# Patient Record
Sex: Male | Born: 1980 | Race: White | Hispanic: No | Marital: Married | Smoking: Never smoker
Health system: Southern US, Community
[De-identification: ages and names within clinical notes are randomized; demographics above are authoritative.]

## PROBLEM LIST (undated history)

## (undated) DIAGNOSIS — U071 COVID-19: Secondary | ICD-10-CM

## (undated) DIAGNOSIS — I10 Essential (primary) hypertension: Secondary | ICD-10-CM

## (undated) HISTORY — PX: WISDOM TOOTH EXTRACTION: SHX21

## (undated) HISTORY — DX: Essential (primary) hypertension: I10

---

## 2017-05-09 ENCOUNTER — Encounter: Payer: Self-pay | Admitting: Family Medicine

## 2017-05-09 ENCOUNTER — Ambulatory Visit (INDEPENDENT_AMBULATORY_CARE_PROVIDER_SITE_OTHER): Payer: Managed Care, Other (non HMO) | Admitting: Family Medicine

## 2017-05-09 VITALS — BP 128/84 | HR 85 | Temp 98.3°F | Resp 16 | Ht 70.0 in | Wt 242.0 lb

## 2017-05-09 DIAGNOSIS — Z13220 Encounter for screening for lipoid disorders: Secondary | ICD-10-CM | POA: Diagnosis not present

## 2017-05-09 DIAGNOSIS — I10 Essential (primary) hypertension: Secondary | ICD-10-CM

## 2017-05-09 MED ORDER — LOSARTAN POTASSIUM-HCTZ 50-12.5 MG PO TABS: 1.0000 | ORAL_TABLET | Freq: Every day | ORAL | 1 refills | Status: DC

## 2017-05-09 NOTE — Progress Notes (Signed)
Subjective:  By signing my name below, I, Cameron Norris, attest that this documentation has been prepared under the direction and in the presence of Cameron Flood, MD Electronically Signed: Charline Bills, ED Scribe 05/09/2017 at 9:13 AM.   Patient ID: Cameron Norris, male    DOB: 1980/11/04, 36 y.o.   MRN: [id number]  Chief Complaint  Patient presents with  . Medication Refill    LOSARTAN/ pt is new to area and need BP medication   HPI [name] is a 36 y.o. male who presents to Primary Care at [facility] for medication refill. New pt here to establish care. Previously received care at the [facility] for HTN. Most recently on Losartan-HCTZ 50-12.5 mg qd. Previous PCP Waldron Session. Last OV September 2017. BP was elevated at 159/90 at that visit. He was changed from ace inhibitor to ARB and added the HCTZ at that visit.   Reports dry cough while taking Lisinopril which resolved once he discontinued the medication. Pt denies sob with Lisinopril. Pt checks his BP outside of the office with average reading of 135/high 80s. Denies chest pain, light-headedness, dizziness, blood in stools, melena. He walks for exercise. Pt is fasting at this visit.  Pt has a 2 y.o. son.   There are no active problems to display for this patient.  Past Medical History:  Diagnosis Date  . Hypertension    Past Surgical History:  Procedure Laterality Date  . WISDOM TOOTH EXTRACTION     Allergies not on file Prior to Admission medications   Not on File   Social History   Social History  . Marital status: Married    Spouse name: N/A  . Number of children: N/A  . Years of education: N/A   Occupational History  . Not on file.   Social History Main Topics  . Smoking status: Never Smoker  . Smokeless tobacco: Never Used  . Alcohol use Yes  . Drug use: No  . Sexual activity: Not on file   Other Topics Concern  . Not on file   Social History Narrative  . No narrative on file    Review of Systems  Constitutional: Negative for fatigue and unexpected weight change.  Eyes: Negative for visual disturbance.  Respiratory: Negative for cough, chest tightness and shortness of breath.   Cardiovascular: Negative for chest pain, palpitations and leg swelling.  Gastrointestinal: Negative for abdominal pain and blood in stool.  Neurological: Negative for dizziness, light-headedness and headaches.      Objective:   Physical Exam  Constitutional: He is oriented to person, place, and time. He appears well-developed and well-nourished.  HENT:  Head: Normocephalic and atraumatic.  Eyes: Pupils are equal, round, and reactive to light. EOM are normal.  Neck: No JVD present. Carotid bruit is not present.  Cardiovascular: Normal rate, regular rhythm and normal heart sounds.   No murmur heard. Pulmonary/Chest: Effort normal and breath sounds normal. He has no rales.  Musculoskeletal: He exhibits no edema.  Neurological: He is alert and oriented to person, place, and time.  Skin: Skin is warm and dry.  Psychiatric: He has a normal mood and affect.  Vitals reviewed.  Vitals:   05/09/17 0853  BP: 128/84  Pulse: 85  Resp: 16  Temp: 98.3 F (36.8 C)  TempSrc: Oral  SpO2: 98%  Weight: 242 lb (109.8 kg)  Height:  (1.778 m)      Assessment & Plan:   Cameron Norris is a  36 y.o. male Essential hypertension - Plan: Comprehensive metabolic panel, losartan-hydrochlorothiazide (HYZAAR) 50-12.5 MG tablet  Screening for hyperlipidemia - Plan: Comprehensive metabolic panel, Lipid panel  Hypertension controlled, and tolerating ARB. Suspect he did have a cough from ace inhibitors, so allergy was placed. Discussed exercise with 150 minutes minimum per week, recheck in 6 months for a physical. Labs ordered, lipid panel ordered.  Meds ordered this encounter  Medications  . DISCONTD: losartan-hydrochlorothiazide (HYZAAR) 50-12.5 MG tablet    Sig: Take 1 tablet by mouth daily.    Marland Kitchen losartan-hydrochlorothiazide (HYZAAR) 50-12.5 MG tablet    Sig: Take 1 tablet by mouth daily.    Dispense:  90 tablet    Refill:  1   Patient Instructions   Nice meeting you. Blood pressure looks okay today. No change in meds. Follow-up in 6 months for a physical. Let me know if you have any questions in the meantime.    IF you received an x-ray today, you will receive an invoice from [facility]. Please contact [facility] at [phone] with questions or concerns regarding your invoice.   IF you received labwork today, you will receive an invoice from [place]. Please contact [facility] at [phone] with questions or concerns regarding your invoice.   Our billing staff will not be able to assist you with questions regarding bills from these companies.  You will be contacted with the lab results as soon as they are available. The fastest way to get your results is to activate your My Chart account. Instructions are located on the last page of this paperwork. If you have not heard from [place] regarding the results in 2 weeks, please contact this office.      I personally performed the services described in this documentation, which was scribed in my presence. The recorded information has been reviewed and considered for accuracy and completeness, addended by me as needed, and agree with information above.  Signed,   Meredith Staggers, MD Primary Care at [facility].  05/09/17 9:25 AM

## 2017-05-09 NOTE — Patient Instructions (Addendum)
Nice meeting you. Blood pressure looks okay today. No change in meds. Follow-up in 6 months for a physical. Let me know if you have any questions in the meantime.    IF you received an x-ray today, you will receive an invoice from [facility]. Please contact [facility] at [phone] with questions or concerns regarding your invoice.   IF you received labwork today, you will receive an invoice from [place]. Please contact [facility] at [phone] with questions or concerns regarding your invoice.   Our billing staff will not be able to assist you with questions regarding bills from these companies.  You will be contacted with the lab results as soon as they are available. The fastest way to get your results is to activate your My Chart account. Instructions are located on the last page of this paperwork. If you have not heard from [place] regarding the results in 2 weeks, please contact this office.

## 2017-05-10 LAB — LIPID PANEL
CHOLESTEROL TOTAL: 210 mg/dL — AB (ref 100–199)
Chol/HDL Ratio: 4.1 ratio (ref 0.0–5.0)
HDL: 51 mg/dL (ref >39)
LDL CALC: 117 mg/dL — AB (ref 0–99)
TRIGLYCERIDES: 211 mg/dL — AB (ref 0–149)
VLDL Cholesterol Cal: 42 mg/dL — ABNORMAL HIGH (ref 5–40)

## 2017-05-10 LAB — COMPREHENSIVE METABOLIC PANEL
ALBUMIN: 4.7 g/dL (ref 3.5–5.5)
ALK PHOS: 51 IU/L (ref 39–117)
ALT: 58 IU/L — ABNORMAL HIGH (ref 0–44)
AST: 33 IU/L (ref 0–40)
Albumin/Globulin Ratio: 1.8 (ref 1.2–2.2)
BUN/Creatinine Ratio: 12 (ref 9–20)
BUN: 10 mg/dL (ref 6–20)
Bilirubin Total: 0.3 mg/dL (ref 0.0–1.2)
CO2: 20 mmol/L (ref 20–29)
CREATININE: 0.82 mg/dL (ref 0.76–1.27)
Calcium: 9.1 mg/dL (ref 8.7–10.2)
Chloride: 101 mmol/L (ref 96–106)
GFR calc Af Amer: 133 mL/min/{1.73_m2} (ref >59)
GFR calc non Af Amer: 115 mL/min/{1.73_m2} (ref >59)
GLUCOSE: 89 mg/dL (ref 65–99)
Globulin, Total: 2.6 g/dL (ref 1.5–4.5)
Potassium: 4.2 mmol/L (ref 3.5–5.2)
Sodium: 140 mmol/L (ref 134–144)
Total Protein: 7.3 g/dL (ref 6.0–8.5)

## 2017-06-14 ENCOUNTER — Telehealth: Payer: Self-pay | Admitting: Family Medicine

## 2017-06-14 NOTE — Telephone Encounter (Signed)
PT IS AT [facility] UNTIL NOV 2-18 HIS PHARMACY ASKED HIM TO CALL US FOR A VACATION REFILL ON HIS BLOOD PRESSURE THE PHARMACY TO SEND TO   [facility] [id number]  PHONE NUMBER [phone]  PT IS COMPLETELY OUT

## 2017-06-16 ENCOUNTER — Other Ambulatory Visit: Payer: Self-pay

## 2017-06-16 DIAGNOSIS — I10 Essential (primary) hypertension: Secondary | ICD-10-CM

## 2017-06-16 MED ORDER — LOSARTAN POTASSIUM-HCTZ 50-12.5 MG PO TABS: 1.0000 | ORAL_TABLET | Freq: Every day | ORAL | 1 refills | Status: DC

## 2017-06-17 NOTE — Telephone Encounter (Signed)
Done on 10/25

## 2017-09-23 ENCOUNTER — Encounter (INDEPENDENT_AMBULATORY_CARE_PROVIDER_SITE_OTHER): Payer: Self-pay | Admitting: Orthopaedic Surgery

## 2017-09-23 ENCOUNTER — Ambulatory Visit (INDEPENDENT_AMBULATORY_CARE_PROVIDER_SITE_OTHER): Payer: Managed Care, Other (non HMO) | Admitting: Orthopaedic Surgery

## 2017-09-23 VITALS — BP 156/96 | HR 67 | Ht 70.0 in | Wt 235.0 lb

## 2017-09-23 DIAGNOSIS — S52135A Nondisplaced fracture of neck of left radius, initial encounter for closed fracture: Secondary | ICD-10-CM | POA: Diagnosis not present

## 2017-09-23 DIAGNOSIS — S52124A Nondisplaced fracture of head of right radius, initial encounter for closed fracture: Secondary | ICD-10-CM

## 2017-09-23 NOTE — Progress Notes (Signed)
Office Visit Note   Patient: Cameron BenceMichael Norris           Date of Birth: [birth year]           MRN: [id number] Visit Date: 09/23/2017              Requested by: No referring provider defined for this encounter. PCP: Patient, No Pcp Per   Assessment & Plan: Visit Diagnoses:  1. Closed nondisplaced fracture of head of right radius, initial encounter   2. Closed nondisplaced fracture of neck of left radius, initial encounter     BILATERAL INJURIES.   Plan: patient states he is able to work. He can remove the left posterior splint for bathing. We discussed gentle ROM excercises working mostly on extension which is the biggest obstacle for motion. Recheck in 3 wks for repeat xrays.   Follow-Up Instructions: Return in about 3 weeks (around 10/14/2017).   Orders:  No orders of the defined types were placed in this encounter.  No orders of the defined types were placed in this encounter.     Procedures: No procedures performed   Clinical Data: No additional findings.   Subjective: Chief Complaint  Patient presents with  . Left Elbow - Pain  . Right Elbow - Pain, Fracture    HPI 37 year old male new patient with acute injuries to both elbows.  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  Patient suffered a closed nondisplaced fracture of the right radial head involving half of the head without depression or angulation..  On the left elbow he suffered a radial neck fracture without angulation or significant displacement.  Patient states left elbow is been much more painful and he has in a posterior splint.  He has a prescription for Norco and has been using ibuprofen.  Patient is a non-smoker no past history of injury to the elbow shoulder pain he did not hit his head no loss of consciousness.  Review of Systems with history for stroke or heart attack previous upper extremity fractures.  Negative for  anesthetic problems for DVT.Marland Kitchen.  Otherwise negative as it pertains to HPI.   Objective: Vital Signs: BP (!) 156/96   Pulse 67   Ht 5\' 10"  (1.778 m)   Wt 235 lb (106.6 kg)   BMI 33.72 kg/m   Physical Exam  Constitutional: He is oriented to person, place, and time. He appears well-developed and well-nourished.  HENT:  Head: Normocephalic and atraumatic.  Eyes: EOM are normal. Pupils are equal, round, and reactive to light.  Neck: No tracheal deviation present. No thyromegaly present.  Cardiovascular: Normal rate.  Pulmonary/Chest: Effort normal. He has no wheezes.  Abdominal: Soft. Bowel sounds are normal.  Neurological: He is alert and oriented to person, place, and time.  Skin: Skin is warm and dry. Capillary refill takes less than 2 seconds.  Psychiatric: He has a normal mood and affect. His behavior is normal. Judgment and thought content normal.    Ortho Exam patient has bilateral elbow hemarthrosis.  Tenderness over the radial head radial neck right and left.  45 degrees range of motion both elbows with more pain on the left than right.  Full shoulder range of motion minimal hand swelling sensation in the fingertips is normal radial sensory nerve median ulnar nerve is intact.  Specialty Comments:  No specialty comments available.  Imaging: X-ray images on disc were reviewed as listed above  from [facility] imaging.  Right radial head fracture, left radial neck fracture both in satisfactory position.   PMFS History: Patient Active Problem List   Diagnosis Date Noted  . Closed nondisplaced fracture of neck of left radius 10/14/2017  . Closed nondisplaced fracture of head of radius with routine healing 10/14/2017   Past Medical History:  Diagnosis Date  . Hypertension     History reviewed. No pertinent family history.  Past Surgical History:  Procedure Laterality Date  . WISDOM TOOTH EXTRACTION     Social History   Occupational History  . Not on file  Tobacco  Use  . Smoking status: Never Smoker  . Smokeless tobacco: Never Used  Substance and Sexual Activity  . Alcohol use: Yes  . Drug use: No  . Sexual activity: Not on file

## 2017-10-14 ENCOUNTER — Encounter (INDEPENDENT_AMBULATORY_CARE_PROVIDER_SITE_OTHER): Payer: Self-pay | Admitting: Orthopaedic Surgery

## 2017-10-14 ENCOUNTER — Ambulatory Visit (INDEPENDENT_AMBULATORY_CARE_PROVIDER_SITE_OTHER): Payer: Managed Care, Other (non HMO) | Admitting: Orthopaedic Surgery

## 2017-10-14 ENCOUNTER — Ambulatory Visit (INDEPENDENT_AMBULATORY_CARE_PROVIDER_SITE_OTHER): Payer: Self-pay

## 2017-10-14 ENCOUNTER — Ambulatory Visit (INDEPENDENT_AMBULATORY_CARE_PROVIDER_SITE_OTHER): Payer: Managed Care, Other (non HMO)

## 2017-10-14 VITALS — BP 138/78 | HR 90 | Ht 70.0 in | Wt 235.0 lb

## 2017-10-14 DIAGNOSIS — M25521 Pain in right elbow: Secondary | ICD-10-CM | POA: Diagnosis not present

## 2017-10-14 DIAGNOSIS — M25522 Pain in left elbow: Secondary | ICD-10-CM

## 2017-10-14 DIAGNOSIS — S52135D Nondisplaced fracture of neck of left radius, subsequent encounter for closed fracture with routine healing: Secondary | ICD-10-CM

## 2017-10-14 DIAGNOSIS — S52126D Nondisplaced fracture of head of unspecified radius, subsequent encounter for closed fracture with routine healing: Secondary | ICD-10-CM | POA: Insufficient documentation

## 2017-10-14 DIAGNOSIS — S52124D Nondisplaced fracture of head of right radius, subsequent encounter for closed fracture with routine healing: Secondary | ICD-10-CM

## 2017-10-14 DIAGNOSIS — S52135A Nondisplaced fracture of neck of left radius, initial encounter for closed fracture: Secondary | ICD-10-CM | POA: Insufficient documentation

## 2017-10-14 NOTE — Progress Notes (Signed)
   Post-Op Visit Note   Patient: Natasha BenceMichael Luchsinger           Date of Birth: [birth year]           MRN: [id number] Visit Date: 10/14/2017 PCP: Patient, No Pcp Per   Assessment & Plan: Follow-up right radial head fracture and left radial neck fracture.  His motion is significantly improved.  He will continue to work on extension.  Follow-up in 4 weeks for repeat x-rays.  Chief Complaint:  Chief Complaint  Patient presents with  . Right Elbow - Follow-up, Fracture    3 wks post closed nondisplaced fx head right radius.  . Left Elbow - Follow-up, Fracture    3 weeks post closed nondisplaced fracture neck left radius.   Visit Diagnoses:  1. Pain in left elbow   2. Pain in right elbow   3. Closed nondisplaced fracture of neck of left radius with routine healing, subsequent encounter   4. Closed nondisplaced fracture of head of right radius with routine healing, subsequent encounter     Plan: Patient has good flexion lacks about 4 fingerbreadths on the left touching thumb to shoulder and right elbow is about 2 fingerbreadths.  He comes almost to full extension lacking maybe 5 degrees.  He is continue to work on extension.  He has been wearing the splint at night just when he sleeps.  I plan to recheck him again in 4 weeks for likely final x-rays.  Follow-Up Instructions: Return in about 4 weeks (around 11/11/2017).   Orders:  Orders Placed This Encounter  Procedures  . XR Elbow 2 Views Left  . XR Elbow 2 Views Right   No orders of the defined types were placed in this encounter.   Imaging: Xr Elbow 2 Views Left  Result Date: 10/14/2017 2 view x-rays left elbow obtained.  This shows the radial neck fracture without angulation and 1 mm impaction.  Radial head is opposite the capitellum. Impression: Follow-up radial neck fracture remaining in satisfactory position.  Xr Elbow 2 Views Right  Result Date: 10/14/2017 Three-view x-rays right elbow including radial head view obtained.  This  shows a radial head fracture involving about one half the joint without  depression and without displacement.  Position is maintained from previous films 3 weeks ago. Impression: Right radial head fracture remaining in satisfactory position.   PMFS History: Patient Active Problem List   Diagnosis Date Noted  . Closed nondisplaced fracture of neck of left radius 10/14/2017  . Closed nondisplaced fracture of head of radius with routine healing 10/14/2017   Past Medical History:  Diagnosis Date  . Hypertension     History reviewed. No pertinent family history.  Past Surgical History:  Procedure Laterality Date  . WISDOM TOOTH EXTRACTION     Social History   Occupational History  . Not on file  Tobacco Use  . Smoking status: Never Smoker  . Smokeless tobacco: Never Used  Substance and Sexual Activity  . Alcohol use: Yes  . Drug use: No  . Sexual activity: Not on file

## 2017-11-11 ENCOUNTER — Encounter (INDEPENDENT_AMBULATORY_CARE_PROVIDER_SITE_OTHER): Payer: Self-pay | Admitting: Orthopaedic Surgery

## 2017-11-11 ENCOUNTER — Ambulatory Visit (INDEPENDENT_AMBULATORY_CARE_PROVIDER_SITE_OTHER): Payer: Self-pay

## 2017-11-11 ENCOUNTER — Ambulatory Visit (INDEPENDENT_AMBULATORY_CARE_PROVIDER_SITE_OTHER): Payer: Managed Care, Other (non HMO) | Admitting: Orthopaedic Surgery

## 2017-11-11 VITALS — BP 149/89 | HR 96

## 2017-11-11 DIAGNOSIS — S52124D Nondisplaced fracture of head of right radius, subsequent encounter for closed fracture with routine healing: Secondary | ICD-10-CM

## 2017-11-11 DIAGNOSIS — S52135D Nondisplaced fracture of neck of left radius, subsequent encounter for closed fracture with routine healing: Secondary | ICD-10-CM

## 2017-11-11 NOTE — Progress Notes (Signed)
   Post-Op Visit Note   Patient: Cameron Norris           Date of Birth: 1981-05-16           MRN: [id number] Visit Date: 11/11/2017 PCP: Patient, No Pcp Per   Assessment & Plan: Follow-up bilateral elbow fractures.  He has some hyperextension on the left he lacks about 5 degrees reaching full extension on the right.  Flexion is good swelling is down his pain is minimal.  He is working.  He can wait 1 month and then slowly begin playing golf starting hitting balls on a T after he set one small bucket on the range to make sure he does not have pain.  He will call if he has any problems.  We reviewed x-rays finding today which showed interval healing.  Chief Complaint:  Chief Complaint  Patient presents with  . Left Elbow - Fracture, Follow-up  . Right Elbow - Fracture, Follow-up   Visit Diagnoses:  1. Closed nondisplaced fracture of neck of left radius with routine healing, subsequent encounter   2. Closed nondisplaced fracture of head of right radius with routine healing, subsequent encounter     Plan: Patient will progress as listed above with plan for gradual resumption of sports activities in 1 month with golf.  He can return if he has  Follow-Up Instructions: No follow-ups on file.   Orders:  Orders Placed This Encounter  Procedures  . XR Elbow Complete Right (3+View)  . XR Elbow 2 Views Left   No orders of the defined types were placed in this encounter.   Imaging: No results found.  PMFS History: Patient Active Problem List   Diagnosis Date Noted  . Closed nondisplaced fracture of neck of left radius 10/14/2017  . Closed nondisplaced fracture of head of radius with routine healing 10/14/2017   Past Medical History:  Diagnosis Date  . Hypertension     No family history on file.  Past Surgical History:  Procedure Laterality Date  . WISDOM TOOTH EXTRACTION     Social History   Occupational History  . Not on file  Tobacco Use  . Smoking status: Never Smoker    . Smokeless tobacco: Never Used  Substance and Sexual Activity  . Alcohol use: Yes  . Drug use: No  . Sexual activity: Not on file

## 2017-12-01 ENCOUNTER — Other Ambulatory Visit: Payer: Self-pay

## 2017-12-02 ENCOUNTER — Other Ambulatory Visit: Payer: Self-pay | Admitting: Family Medicine

## 2017-12-02 MED ORDER — HYDROCHLOROTHIAZIDE 12.5 MG PO CAPS: 12.5000 mg | ORAL_CAPSULE | Freq: Every day | ORAL | 0 refills | Status: DC

## 2017-12-02 MED ORDER — LOSARTAN POTASSIUM 50 MG PO TABS: 50.0000 mg | ORAL_TABLET | Freq: Every day | ORAL | 0 refills | Status: DC

## 2017-12-08 ENCOUNTER — Ambulatory Visit: Payer: Managed Care, Other (non HMO) | Admitting: Family Medicine

## 2017-12-15 ENCOUNTER — Other Ambulatory Visit: Payer: Self-pay

## 2017-12-15 ENCOUNTER — Ambulatory Visit: Payer: Managed Care, Other (non HMO) | Admitting: Family Medicine

## 2017-12-15 ENCOUNTER — Encounter: Payer: Self-pay | Admitting: Family Medicine

## 2017-12-15 VITALS — BP 142/82 | HR 89 | Temp 98.1°F | Ht 70.0 in | Wt 245.4 lb

## 2017-12-15 DIAGNOSIS — I10 Essential (primary) hypertension: Secondary | ICD-10-CM

## 2017-12-15 DIAGNOSIS — E785 Hyperlipidemia, unspecified: Secondary | ICD-10-CM

## 2017-12-15 MED ORDER — HYDROCHLOROTHIAZIDE 12.5 MG PO CAPS: 12.5000 mg | ORAL_CAPSULE | Freq: Every day | ORAL | 1 refills | Status: DC

## 2017-12-15 MED ORDER — LOSARTAN POTASSIUM 50 MG PO TABS: 50.0000 mg | ORAL_TABLET | Freq: Every day | ORAL | 1 refills | Status: DC

## 2017-12-15 NOTE — Patient Instructions (Addendum)
Okay to continue same dose of medications for now. Increase physical activity, watch diet and see information below on hypertension, and recheck in the next 3 to 6 months for a physical.  If persistently elevated at that time, we can adjust the doses.  If you are noticing higher blood pressure readings outside of office, I am happy to see you sooner to adjust that medication.  I will check cholesterol today, but again we can see what diet/exercise changes may do with repeat testing in 3 to 6 months.  Let me know if you have any questions, and thank you for coming in today.   Hypertension Hypertension, commonly called high blood pressure, is when the force of blood pumping through the arteries is too strong. The arteries are the blood vessels that carry blood from the heart throughout the body. Hypertension forces the heart to work harder to pump blood and may cause arteries to become narrow or stiff. Having untreated or uncontrolled hypertension can cause heart attacks, strokes, kidney disease, and other problems. A blood pressure reading consists of a higher number over a lower number. Ideally, your blood pressure should be below 120/80. The first ("top") number is called the systolic pressure. It is a measure of the pressure in your arteries as your heart beats. The second ("bottom") number is called the diastolic pressure. It is a measure of the pressure in your arteries as the heart relaxes. What are the causes? The cause of this condition is not known. What increases the risk? Some risk factors for high blood pressure are under your control. Others are not. Factors you can change  Smoking.  Having type 2 diabetes mellitus, high cholesterol, or both.  Not getting enough exercise or physical activity.  Being overweight.  Having too much fat, sugar, calories, or salt (sodium) in your diet.  Drinking too much alcohol. Factors that are difficult or impossible to change  Having chronic kidney  disease.  Having a family history of high blood pressure.  Age. Risk increases with age.  Race. You may be at higher risk if you are African-American.  Gender. Men are at higher risk than women before age 46. After age 23, women are at higher risk than men.  Having obstructive sleep apnea.  Stress. What are the signs or symptoms? Extremely high blood pressure (hypertensive crisis) may cause:  Headache.  Anxiety.  Shortness of breath.  Nosebleed.  Nausea and vomiting.  Severe chest pain.  Jerky movements you cannot control (seizures).  How is this diagnosed? This condition is diagnosed by measuring your blood pressure while you are seated, with your arm resting on a surface. The cuff of the blood pressure monitor will be placed directly against the skin of your upper arm at the level of your heart. It should be measured at least twice using the same arm. Certain conditions can cause a difference in blood pressure between your right and left arms. Certain factors can cause blood pressure readings to be lower or higher than normal (elevated) for a short period of time:  When your blood pressure is higher when you are in a health care provider's office than when you are at home, this is called white coat hypertension. Most people with this condition do not need medicines.  When your blood pressure is higher at home than when you are in a health care provider's office, this is called masked hypertension. Most people with this condition may need medicines to control blood pressure.  If  you have a high blood pressure reading during one visit or you have normal blood pressure with other risk factors:  You may be asked to return on a different day to have your blood pressure checked again.  You may be asked to monitor your blood pressure at home for 1 week or longer.  If you are diagnosed with hypertension, you may have other blood or imaging tests to help your health care provider  understand your overall risk for other conditions. How is this treated? This condition is treated by making healthy lifestyle changes, such as eating healthy foods, exercising more, and reducing your alcohol intake. Your health care provider may prescribe medicine if lifestyle changes are not enough to get your blood pressure under control, and if:  Your systolic blood pressure is above 130.  Your diastolic blood pressure is above 80.  Your personal target blood pressure may vary depending on your medical conditions, your age, and other factors. Follow these instructions at home: Eating and drinking  Eat a diet that is high in fiber and potassium, and low in sodium, added sugar, and fat. An example eating plan is called the DASH (Dietary Approaches to Stop Hypertension) diet. To eat this way: ? Eat plenty of fresh fruits and vegetables. Try to fill half of your plate at each meal with fruits and vegetables. ? Eat whole grains, such as whole wheat pasta, brown rice, or whole grain bread. Fill about one quarter of your plate with whole grains. ? Eat or drink low-fat dairy products, such as skim milk or low-fat yogurt. ? Avoid fatty cuts of meat, processed or cured meats, and poultry with skin. Fill about one quarter of your plate with lean proteins, such as fish, chicken without skin, beans, eggs, and tofu. ? Avoid premade and processed foods. These tend to be higher in sodium, added sugar, and fat.  Reduce your daily sodium intake. Most people with hypertension should eat less than 1,500 mg of sodium a day.  Limit alcohol intake to no more than 1 drink a day for nonpregnant women and 2 drinks a day for men. One drink equals 12 oz of beer, 5 oz of wine, or 1 oz of hard liquor. Lifestyle  Work with your health care provider to maintain a healthy body weight or to lose weight. Ask what an ideal weight is for you.  Get at least 30 minutes of exercise that causes your heart to beat faster  (aerobic exercise) most days of the week. Activities may include walking, swimming, or biking.  Include exercise to strengthen your muscles (resistance exercise), such as pilates or lifting weights, as part of your weekly exercise routine. Try to do these types of exercises for 30 minutes at least 3 days a week.  Do not use any products that contain nicotine or tobacco, such as cigarettes and e-cigarettes. If you need help quitting, ask your health care provider.  Monitor your blood pressure at home as told by your health care provider.  Keep all follow-up visits as told by your health care provider. This is important. Medicines  Take over-the-counter and prescription medicines only as told by your health care provider. Follow directions carefully. Blood pressure medicines must be taken as prescribed.  Do not skip doses of blood pressure medicine. Doing this puts you at risk for problems and can make the medicine less effective.  Ask your health care provider about side effects or reactions to medicines that you should watch for. Contact a  health care provider if:  You think you are having a reaction to a medicine you are taking.  You have headaches that keep coming back (recurring).  You feel dizzy.  You have swelling in your ankles.  You have trouble with your vision. Get help right away if:  You develop a severe headache or confusion.  You have unusual weakness or numbness.  You feel faint.  You have severe pain in your chest or abdomen.  You vomit repeatedly.  You have trouble breathing. Summary  Hypertension is when the force of blood pumping through your arteries is too strong. If this condition is not controlled, it may put you at risk for serious complications.  Your personal target blood pressure may vary depending on your medical conditions, your age, and other factors. For most people, a normal blood pressure is less than 120/80.  Hypertension is treated with  lifestyle changes, medicines, or a combination of both. Lifestyle changes include weight loss, eating a healthy, low-sodium diet, exercising more, and limiting alcohol. This information is not intended to replace advice given to you by your health care provider. Make sure you discuss any questions you have with your health care provider. Document Released: 08/09/2005 Document Revised: 07/07/2016 Document Reviewed: 07/07/2016 [employer] Interactive Patient Education  2018 [employer]   IF you received an x-ray today, you will receive an invoice from [facility]. Please contact [facility] at [phone] with questions or concerns regarding your invoice.   IF you received labwork today, you will receive an invoice from [facility]. Please contact [facility] at [phone] with questions or concerns regarding your invoice.   Our billing staff will not be able to assist you with questions regarding bills from these companies.  You will be contacted with the lab results as soon as they are available. The fastest way to get your results is to activate your My Chart account. Instructions are located on the last page of this paperwork. If you have not heard from us regarding the results in 2 weeks, please contact this office.

## 2017-12-15 NOTE — Progress Notes (Signed)
By signing my name below, I, Schuyler Bain, attest that this documentation has been prepared under the direction and in the presence of Dr. Asencion Partridge. Cameron Norris.   Electronically Signed: Marcelline Mates, Medical Scribe 12/15/2017 at 11:11 AM.  Subjective:    Patient ID: Cameron Norris, male    DOB: 05/09/1981, 37 y.o.   MRN: [id number] Chief Complaint  Patient presents with  . Chronic Conditions    BP recheck and Med refill     HPI Cameron Norris is a 37 y.o. male who presents to Primary Care at [facility] for follow up of hypertension and was last seen in September 2018.   Hypertension: We discussed exercise at last visit. Hyzaar 50/12.5mg . He notes that his medication has not given him any trouble. His at home BP readings have been around the 130s/80s.   Lab Results  Component Value Date   CREATININE 0.82 05/09/2017   BP Readings from Last 3 Encounters:  12/15/17 (!) 142/82  11/11/17 (!) 149/89  10/14/17 138/78    Wt Readings from Last 3 Encounters:  12/15/17 245 lb 6.4 oz (111.3 kg)  10/14/17 235 lb (106.6 kg)  09/23/17 235 lb (106.6 kg)   Exercise: He notes that he fractured both of his arms in January after falling while riding an [employer] gallery [employer]. He fell on outstretched hands and had a hairline fracture of radial head on left arm and fracture of radial head on right arm. He notes a near complete return to his ROM compared to pre-injury. He also notes that these fractures negatively impacted his exercise.   Hyperlipidemia Lab Results  Component Value Date   CHOL 210 (H) 05/09/2017   HDL 51 05/09/2017   LDLCALC 117 (H) 05/09/2017   TRIG 211 (H) 05/09/2017   CHOLHDL 4.1 05/09/2017   Lab Results  Component Value Date   ALT 58 (H) 05/09/2017   AST 33 05/09/2017   ALKPHOS 51 05/09/2017   BILITOT 0.3 05/09/2017   Diet and exercise were recommended at last visit. He notes that he is fasting this morning. He notes that he is hopeful to make changes to his diet and exercise before  his next visit and wants to see if these changes affect his numbers positively.   The ASCVD Risk score Cameron George DC Jr., et al., 2013) failed to calculate for the following reasons:   The 2013 ASCVD risk score is only valid for ages 39 to 68    Patient Active Problem List   Diagnosis Date Noted  . Closed nondisplaced fracture of neck of left radius 10/14/2017  . Closed nondisplaced fracture of head of radius with routine healing 10/14/2017   Past Medical History:  Diagnosis Date  . Hypertension    Past Surgical History:  Procedure Laterality Date  . WISDOM TOOTH EXTRACTION     Allergies  Allergen Reactions  . Ace Inhibitors Cough   Prior to Admission medications   Medication Sig Start Date End Date Taking? Authorizing Provider  hydrochlorothiazide (MICROZIDE) 12.5 MG capsule Take 1 capsule (12.5 mg total) by mouth daily. 12/02/17   [name], MD  HYDROcodone-acetaminophen (NORCO/VICODIN) 5-325 MG tablet  09/20/17   [provider]  ibuprofen (ADVIL,MOTRIN) 200 MG tablet Take 200 mg by mouth every 6 (six) hours as needed.    [provider]  losartan (COZAAR) 50 MG tablet Take 1 tablet (50 mg total) by mouth daily. 12/02/17   [name], MD   Social History   Socioeconomic History  . Marital  status: Married    Spouse name: Not on file  . Number of children: Not on file  . Years of education: Not on file  . Highest education level: Not on file  Occupational History  . Not on file  Social Needs  . Financial resource strain: Not on file  . Food insecurity:    Worry: Not on file    Inability: Not on file  . Transportation needs:    Medical: Not on file    Non-medical: Not on file  Tobacco Use  . Smoking status: Never Smoker  . Smokeless tobacco: Never Used  Substance and Sexual Activity  . Alcohol use: Yes  . Drug use: No  . Sexual activity: Not on file  Lifestyle  . Physical activity:    Days per week: Not on file    Minutes per  session: Not on file  . Stress: Not on file  Relationships  . Social connections:    Talks on phone: Not on file    Gets together: Not on file    Attends religious service: Not on file    Active member of club or organization: Not on file    Attends meetings of clubs or organizations: Not on file    Relationship status: Not on file  . Intimate partner violence:    Fear of current or ex partner: Not on file    Emotionally abused: Not on file    Physically abused: Not on file    Forced sexual activity: Not on file  Other Topics Concern  . Not on file  Social History Narrative  . Not on file    Review of Systems  Constitutional: Negative for fatigue and unexpected weight change.  Eyes: Negative for visual disturbance.  Respiratory: Negative for cough, chest tightness and shortness of breath.   Cardiovascular: Negative for chest pain, palpitations and leg swelling.  Gastrointestinal: Negative for abdominal pain and blood in stool.  Neurological: Negative for dizziness, light-headedness and headaches.       Objective:   Physical Exam  Constitutional: He is oriented to person, place, and time. He appears well-developed and well-nourished.  HENT:  Head: Normocephalic and atraumatic.  Eyes: Pupils are equal, round, and reactive to light. EOM are normal.  Neck: No JVD present. Carotid bruit is not present.  Cardiovascular: Normal rate, regular rhythm and normal heart sounds.  No murmur heard. Pulmonary/Chest: Effort normal and breath sounds normal. He has no rales.  Musculoskeletal: He exhibits no edema.  Neurological: He is alert and oriented to person, place, and time.  Skin: Skin is warm and dry.  Psychiatric: He has a normal mood and affect.  Vitals reviewed.  Vitals:   12/15/17 1048  BP: (!) 142/82  Pulse: 89  Temp: 98.1 F (36.7 C)  TempSrc: Oral  SpO2: 96%  Weight: 245 lb 6.4 oz (111.3 kg)  Height: 5\' 10"  (1.778 m)        Assessment & Plan:    Cameron Norris is a 37 y.o. male Essential hypertension - Plan: losartan (COZAAR) 50 MG tablet, hydrochlorothiazide (MICROZIDE) 12.5 MG capsule, Comprehensive metabolic panel  -Same regimen continue for now as plans on increasing exercise, diet changes, handout given.  Recheck in 3 to 6 months for physical, labs pending.   Hyperlipidemia, unspecified hyperlipidemia type - Plan: Lipid panel  -Exercise has been limited due to previous orthopedic issues, now has returned to exercise.  Check levels, consider allowing continue diet/exercise approach with repeat testing and  physical.  Meds ordered this encounter  Medications  . losartan (COZAAR) 50 MG tablet    Sig: Take 1 tablet (50 mg total) by mouth daily.    Dispense:  90 tablet    Refill:  1  . hydrochlorothiazide (MICROZIDE) 12.5 MG capsule    Sig: Take 1 capsule (12.5 mg total) by mouth daily.    Dispense:  90 capsule    Refill:  1   Patient Instructions   Okay to continue same dose of medications for now. Increase physical activity, watch diet and see information below on hypertension, and recheck in the next 3 to 6 months for a physical.  If persistently elevated at that time, we can adjust the doses.  If you are noticing higher blood pressure readings outside of office, I am happy to see you sooner to adjust that medication.  I will check cholesterol today, but again we can see what diet/exercise changes may do with repeat testing in 3 to 6 months.  Let me know if you have any questions, and thank you for coming in today.   Hypertension Hypertension, commonly called high blood pressure, is when the force of blood pumping through the arteries is too strong. The arteries are the blood vessels that carry blood from the heart throughout the body. Hypertension forces the heart to work harder to pump blood and may cause arteries to become narrow or stiff. Having untreated or uncontrolled hypertension can cause heart attacks, strokes, kidney disease, and  other problems. A blood pressure reading consists of a higher number over a lower number. Ideally, your blood pressure should be below 120/80. The first ("top") number is called the systolic pressure. It is a measure of the pressure in your arteries as your heart beats. The second ("bottom") number is called the diastolic pressure. It is a measure of the pressure in your arteries as the heart relaxes. What are the causes? The cause of this condition is not known. What increases the risk? Some risk factors for high blood pressure are under your control. Others are not. Factors you can change  Smoking.  Having type 2 diabetes mellitus, high cholesterol, or both.  Not getting enough exercise or physical activity.  Being overweight.  Having too much fat, sugar, calories, or salt (sodium) in your diet.  Drinking too much alcohol. Factors that are difficult or impossible to change  Having chronic kidney disease.  Having a family history of high blood pressure.  Age. Risk increases with age.  Race. You may be at higher risk if you are African-American.  Gender. Men are at higher risk than women before age 36. After age 36, women are at higher risk than men.  Having obstructive sleep apnea.  Stress. What are the signs or symptoms? Extremely high blood pressure (hypertensive crisis) may cause:  Headache.  Anxiety.  Shortness of breath.  Nosebleed.  Nausea and vomiting.  Severe chest pain.  Jerky movements you cannot control (seizures).  How is this diagnosed? This condition is diagnosed by measuring your blood pressure while you are seated, with your arm resting on a surface. The cuff of the blood pressure monitor will be placed directly against the skin of your upper arm at the level of your heart. It should be measured at least twice using the same arm. Certain conditions can cause a difference in blood pressure between your right and left arms. Certain factors can cause  blood pressure readings to be lower or higher than normal (elevated)  for a short period of time:  When your blood pressure is higher when you are in a health care provider's office than when you are at home, this is called white coat hypertension. Most people with this condition do not need medicines.  When your blood pressure is higher at home than when you are in a health care provider's office, this is called masked hypertension. Most people with this condition may need medicines to control blood pressure.  If you have a high blood pressure reading during one visit or you have normal blood pressure with other risk factors:  You may be asked to return on a different day to have your blood pressure checked again.  You may be asked to monitor your blood pressure at home for 1 week or longer.  If you are diagnosed with hypertension, you may have other blood or imaging tests to help your health care provider understand your overall risk for other conditions. How is this treated? This condition is treated by making healthy lifestyle changes, such as eating healthy foods, exercising more, and reducing your alcohol intake. Your health care provider may prescribe medicine if lifestyle changes are not enough to get your blood pressure under control, and if:  Your systolic blood pressure is above 130.  Your diastolic blood pressure is above 80.  Your personal target blood pressure may vary depending on your medical conditions, your age, and other factors. Follow these instructions at home: Eating and drinking  Eat a diet that is high in fiber and potassium, and low in sodium, added sugar, and fat. An example eating plan is called the DASH (Dietary Approaches to Stop Hypertension) diet. To eat this way: ? Eat plenty of fresh fruits and vegetables. Try to fill half of your plate at each meal with fruits and vegetables. ? Eat whole grains, such as whole wheat pasta, brown rice, or whole grain bread.  Fill about one quarter of your plate with whole grains. ? Eat or drink low-fat dairy products, such as skim milk or low-fat yogurt. ? Avoid fatty cuts of meat, processed or cured meats, and poultry with skin. Fill about one quarter of your plate with lean proteins, such as fish, chicken without skin, beans, eggs, and tofu. ? Avoid premade and processed foods. These tend to be higher in sodium, added sugar, and fat.  Reduce your daily sodium intake. Most people with hypertension should eat less than 1,500 mg of sodium a day.  Limit alcohol intake to no more than 1 drink a day for nonpregnant women and 2 drinks a day for men. One drink equals 12 oz of beer, 5 oz of wine, or 1 oz of hard liquor. Lifestyle  Work with your health care provider to maintain a healthy body weight or to lose weight. Ask what an ideal weight is for you.  Get at least 30 minutes of exercise that causes your heart to beat faster (aerobic exercise) most days of the week. Activities may include walking, swimming, or biking.  Include exercise to strengthen your muscles (resistance exercise), such as pilates or lifting weights, as part of your weekly exercise routine. Try to do these types of exercises for 30 minutes at least 3 days a week.  Do not use any products that contain nicotine or tobacco, such as cigarettes and e-cigarettes. If you need help quitting, ask your health care provider.  Monitor your blood pressure at home as told by your health care provider.  Keep all follow-up visits  as told by your health care provider. This is important. Medicines  Take over-the-counter and prescription medicines only as told by your health care provider. Follow directions carefully. Blood pressure medicines must be taken as prescribed.  Do not skip doses of blood pressure medicine. Doing this puts you at risk for problems and can make the medicine less effective.  Ask your health care provider about side effects or reactions  to medicines that you should watch for. Contact a health care provider if:  You think you are having a reaction to a medicine you are taking.  You have headaches that keep coming back (recurring).  You feel dizzy.  You have swelling in your ankles.  You have trouble with your vision. Get help right away if:  You develop a severe headache or confusion.  You have unusual weakness or numbness.  You feel faint.  You have severe pain in your chest or abdomen.  You vomit repeatedly.  You have trouble breathing. Summary  Hypertension is when the force of blood pumping through your arteries is too strong. If this condition is not controlled, it may put you at risk for serious complications.  Your personal target blood pressure may vary depending on your medical conditions, your age, and other factors. For most people, a normal blood pressure is less than 120/80.  Hypertension is treated with lifestyle changes, medicines, or a combination of both. Lifestyle changes include weight loss, eating a healthy, low-sodium diet, exercising more, and limiting alcohol. This information is not intended to replace advice given to you by your health care provider. Make sure you discuss any questions you have with your health care provider. Document Released: 08/09/2005 Document Revised: 07/07/2016 Document Reviewed: 07/07/2016 Elsevier Interactive Patient Education  2018 [employer].   IF you received an x-ray today, you will receive an invoice from [facility]. Please contact [facility] at [phone] with questions or concerns regarding your invoice.   IF you received labwork today, you will receive an invoice from [facility]. Please contact [facility] at [phone] with questions or concerns regarding your invoice.   Our billing staff will not be able to assist you with questions regarding bills from these companies.  You will be contacted with the lab results as soon as  they are available. The fastest way to get your results is to activate your My Chart account. Instructions are located on the last page of this paperwork. If you have not heard from [place] regarding the results in 2 weeks, please contact this office.       I personally performed the services described in this documentation, which was scribed in my presence. The recorded information has been reviewed and considered for accuracy and completeness, addended by me as needed, and agree with information above.  Signed,   Meredith Staggers, MD Primary Care at [facility].  12/15/17 11:17 AM

## 2017-12-16 LAB — COMPREHENSIVE METABOLIC PANEL
A/G RATIO: 1.9 (ref 1.2–2.2)
ALT: 58 IU/L — ABNORMAL HIGH (ref 0–44)
AST: 37 IU/L (ref 0–40)
Albumin: 5.1 g/dL (ref 3.5–5.5)
Alkaline Phosphatase: 49 IU/L (ref 39–117)
BILIRUBIN TOTAL: 0.5 mg/dL (ref 0.0–1.2)
BUN/Creatinine Ratio: 13 (ref 9–20)
BUN: 11 mg/dL (ref 6–20)
CALCIUM: 10.1 mg/dL (ref 8.7–10.2)
CO2: 23 mmol/L (ref 20–29)
Chloride: 100 mmol/L (ref 96–106)
Creatinine, Ser: 0.86 mg/dL (ref 0.76–1.27)
GFR, EST AFRICAN AMERICAN: 130 mL/min/{1.73_m2} (ref >59)
GFR, EST NON AFRICAN AMERICAN: 112 mL/min/{1.73_m2} (ref >59)
GLOBULIN, TOTAL: 2.7 g/dL (ref 1.5–4.5)
Glucose: 103 mg/dL — ABNORMAL HIGH (ref 65–99)
POTASSIUM: 3.9 mmol/L (ref 3.5–5.2)
SODIUM: 139 mmol/L (ref 134–144)
Total Protein: 7.8 g/dL (ref 6.0–8.5)

## 2017-12-16 LAB — LIPID PANEL
CHOL/HDL RATIO: 4.2 ratio (ref 0.0–5.0)
Cholesterol, Total: 215 mg/dL — ABNORMAL HIGH (ref 100–199)
HDL: 51 mg/dL (ref >39)
LDL Calculated: 127 mg/dL — ABNORMAL HIGH (ref 0–99)
TRIGLYCERIDES: 183 mg/dL — AB (ref 0–149)
VLDL Cholesterol Cal: 37 mg/dL (ref 5–40)

## 2018-01-12 ENCOUNTER — Telehealth: Payer: Self-pay | Admitting: Family Medicine

## 2018-01-12 DIAGNOSIS — I10 Essential (primary) hypertension: Secondary | ICD-10-CM

## 2018-01-12 NOTE — Telephone Encounter (Signed)
Copied from CRM [id number]. Topic: Quick Communication - Rx Refill/Question >> Jan 12, 2018 12:59 PM Zada Girt, Lumin L wrote: Medication: losartan (COZAAR) 50 MG tablet, hydrochlorothiazide (MICROZIDE) 12.5 MG capsule   Has the patient contacted their pharmacy? Yes.   (Agent: If no, request that the patient contact the pharmacy for the refill.) (Agent: If yes, when and what did the pharmacy advise?)  Preferred Pharmacy (with phone number or street name): [facility] - [place], [place] - [address] [place] [address] Phone: [phone] Fax: [phone]  Agent: Please be advised that RX refills may take up to 3 business days. We ask that you follow-up with your pharmacy.

## 2018-01-12 NOTE — Telephone Encounter (Signed)
Attempted to call patient to talk about his request for refills. He wants his prescriptions to go to [facility].  Left message for him to call them and have his prescriptions transferred over from Karin Golden at [facility]. Advised to call back with any concerns.

## 2018-01-21 MED ORDER — LOSARTAN POTASSIUM 50 MG PO TABS: 50.0000 mg | ORAL_TABLET | Freq: Every day | ORAL | 1 refills | Status: DC

## 2018-01-21 MED ORDER — HYDROCHLOROTHIAZIDE 12.5 MG PO CAPS: 12.5000 mg | ORAL_CAPSULE | Freq: Every day | ORAL | 1 refills | Status: DC

## 2018-01-21 NOTE — Addendum Note (Signed)
Addended by: Lisbeth RenshawHAN VELASCO, Marquette Blodgett HUA on: 01/21/2018 03:56 PM   Modules accepted: Orders

## 2018-01-21 NOTE — Telephone Encounter (Signed)
The medication was reordered and sent to the [facility] pharmacy per patient request.

## 2018-05-15 ENCOUNTER — Telehealth: Payer: Self-pay | Admitting: Family Medicine

## 2018-05-15 DIAGNOSIS — Z20818 Contact with and (suspected) exposure to other bacterial communicable diseases: Secondary | ICD-10-CM

## 2018-05-15 NOTE — Telephone Encounter (Signed)
Pt reports his wife saw Dr. Neva SeatGreene last week,  was dx with strep throat, result  report received yesterday. States wife was started on amoxicillin.  Pt states he now has a "Runny nose and throat is raw." States afebrile. Pt requesting antibiotic be called in without being seen.  States they have 652 month old infant and he "Doesn't want to wait long for strep results."  Please advise:  [phone]

## 2018-05-15 NOTE — Telephone Encounter (Signed)
Copied from CRM [id number]. Topic: Quick Communication - See Telephone Encounter >> May 15, 2018  6:21 PM Jens SomMedley, Jennifer A wrote: CRM for notification. See Telephone encounter for: 05/15/18. Patient is calling because his wife tested positive for strept throat.  He wants to know if he can be prescribed amoxicillin with out being seen. He has a 58month old in the home. [phone]

## 2018-05-16 ENCOUNTER — Encounter: Payer: Managed Care, Other (non HMO) | Admitting: Family Medicine

## 2018-05-16 NOTE — Telephone Encounter (Signed)
Please advise 

## 2018-05-16 NOTE — Telephone Encounter (Signed)
Pt is calling in for an update on script

## 2018-05-17 MED ORDER — AMOXICILLIN 500 MG PO CAPS: 500.0000 mg | ORAL_CAPSULE | Freq: Three times a day (TID) | ORAL | 0 refills | Status: DC

## 2018-05-17 NOTE — Telephone Encounter (Signed)
Patient spouse with beta-hemolytic non-group A noted on culture.  Based on close exposure and symptoms will prescribe amoxicillin, sent to pharmacy.   My chart message sent to patient.

## 2018-05-30 ENCOUNTER — Encounter: Payer: Managed Care, Other (non HMO) | Admitting: Family Medicine

## 2018-07-07 ENCOUNTER — Other Ambulatory Visit: Payer: Self-pay | Admitting: Family Medicine

## 2018-07-07 DIAGNOSIS — I10 Essential (primary) hypertension: Secondary | ICD-10-CM

## 2018-07-07 NOTE — Telephone Encounter (Signed)
Requested Prescriptions  Pending Prescriptions Disp Refills  . losartan (COZAAR) 50 MG tablet [Pharmacy Med Name: LOSARTAN TABS 50MG ] 90 tablet 4    Sig: TAKE 1 TABLET DAILY     Cardiovascular:  Angiotensin Receptor Blockers Failed - 07/07/2018 12:53 AM      Failed - Cr in normal range and within 180 days    Creatinine, Ser  Date Value Ref Range Status  12/15/2017 0.86 0.76 - 1.27 mg/dL Final         Failed - K in normal range and within 180 days    Potassium  Date Value Ref Range Status  12/15/2017 3.9 3.5 - 5.2 mmol/L Final         Failed - Last BP in normal range    BP Readings from Last 1 Encounters:  12/15/17 (!) 142/82         Failed - Valid encounter within last 6 months    Recent Outpatient Visits          6 months ago Essential hypertension   [facility], Asencion PartridgeJeffrey R, MD   1 year ago Essential hypertension   [facility], Asencion PartridgeJeffrey R, MD      Future Appointments            In 4 weeks Neva SeatGreene, Asencion PartridgeJeffrey R, [facility]           Passed - Patient is not pregnant    . hydrochlorothiazide (MICROZIDE) 12.5 MG capsule [Pharmacy Med Name: HYDROCHLOROTHIAZIDE CAPS 12.5MG ] 90 capsule 4    Sig: TAKE 1 CAPSULE DAILY     Cardiovascular: Diuretics - Thiazide Failed - 07/07/2018 12:53 AM      Failed - Last BP in normal range    BP Readings from Last 1 Encounters:  12/15/17 (!) 142/82         Failed - Valid encounter within last 6 months    Recent Outpatient Visits          6 months ago Essential hypertension   [facility], Asencion PartridgeJeffrey R, MD   1 year ago Essential hypertension   [facility], Asencion PartridgeJeffrey R, MD      Future Appointments            In 4 weeks Shade FloodGreene, Jeffrey R, [facility]           Passed - Ca in normal range and within 360 days    Calcium  Date Value Ref Range Status  12/15/2017 10.1 8.7 - 10.2 mg/dL Final         Passed - Cr in normal range  and within 360 days    Creatinine, Ser  Date Value Ref Range Status  12/15/2017 0.86 0.76 - 1.27 mg/dL Final         Passed - K in normal range and within 360 days    Potassium  Date Value Ref Range Status  12/15/2017 3.9 3.5 - 5.2 mmol/L Final         Passed - Na in normal range and within 360 days    Sodium  Date Value Ref Range Status  12/15/2017 139 134 - 144 mmol/L Final

## 2018-08-04 ENCOUNTER — Ambulatory Visit (INDEPENDENT_AMBULATORY_CARE_PROVIDER_SITE_OTHER): Payer: Managed Care, Other (non HMO) | Admitting: Family Medicine

## 2018-08-04 ENCOUNTER — Encounter: Payer: Self-pay | Admitting: Family Medicine

## 2018-08-04 ENCOUNTER — Other Ambulatory Visit: Payer: Self-pay

## 2018-08-04 VITALS — BP 144/70 | HR 81 | Temp 98.4°F | Ht 70.0 in | Wt 242.2 lb

## 2018-08-04 DIAGNOSIS — E785 Hyperlipidemia, unspecified: Secondary | ICD-10-CM

## 2018-08-04 DIAGNOSIS — Z6834 Body mass index (BMI) 34.0-34.9, adult: Secondary | ICD-10-CM

## 2018-08-04 DIAGNOSIS — Z0000 Encounter for general adult medical examination without abnormal findings: Secondary | ICD-10-CM | POA: Diagnosis not present

## 2018-08-04 DIAGNOSIS — Z114 Encounter for screening for human immunodeficiency virus [HIV]: Secondary | ICD-10-CM

## 2018-08-04 DIAGNOSIS — I10 Essential (primary) hypertension: Secondary | ICD-10-CM

## 2018-08-04 MED ORDER — LOSARTAN POTASSIUM 100 MG PO TABS: 100.0000 mg | ORAL_TABLET | Freq: Every day | ORAL | 2 refills | Status: DC

## 2018-08-04 MED ORDER — HYDROCHLOROTHIAZIDE 12.5 MG PO CAPS: 12.5000 mg | ORAL_CAPSULE | Freq: Every day | ORAL | 2 refills | Status: DC

## 2018-08-04 NOTE — Progress Notes (Addendum)
Subjective:    Patient ID: Cameron Norris, male    DOB: May 05, 1981, 37 y.o.   MRN: [id number]  HPI Cameron Norris is a 37 y.o. male Presents today for: Chief Complaint  Patient presents with  . Annual Exam    cpe   No changes in health.  Works with [employer]. Less travel recently.  Work has been busy.  42 and 35/41 month old and 37 yr old.   Hypertension: BP Readings from Last 3 Encounters:  08/04/18 (!) 144/70  12/15/17 (!) 142/82  11/11/17 (!) 149/89   Lab Results  Component Value Date   CREATININE 0.86 12/15/2017  takes losartan 50mg , hctz 12.5mg  Rare missed doses.  Home BP 130's over 80-85. Fasting today.  No new med side effects.  Immunizations: Immunization History  Administered Date(s) Administered  . Influenza-Unspecified 06/04/2018  Tdap given 09/06/2014  Depression screen: Depression screen [facility] 2/9 08/04/2018 12/15/2017 05/09/2017  Decreased Interest 0 0 0  Down, Depressed, Hopeless 0 0 0  PHQ - 2 Score 0 0 0   Vision screen:  Visual Acuity Screening   Right eye Left eye Both eyes  Without correction: 20/15 20/15-1 20/13-1  With correction:      Dental: every 6 months.   Exercise/overweight/obesity: Wt Readings from Last 3 Encounters:  08/04/18 242 lb 3.2 oz (109.9 kg)  12/15/17 245 lb 6.4 oz (111.3 kg)  10/14/17 235 lb (106.6 kg)  Body mass index is 34.75 kg/m.    STI testing:Declines   Hyperlipidemia:  Lab Results  Component Value Date   CHOL 215 (H) 12/15/2017   HDL 51 12/15/2017   LDLCALC 127 (H) 12/15/2017   TRIG 183 (H) 12/15/2017   CHOLHDL 4.2 12/15/2017   Lab Results  Component Value Date   ALT 58 (H) 12/15/2017   AST 37 12/15/2017   ALKPHOS 49 12/15/2017   BILITOT 0.5 12/15/2017   No current meds.   Patient Active Problem List   Diagnosis Date Noted  . Closed nondisplaced fracture of neck of left radius 10/14/2017  . Closed nondisplaced fracture of head of radius with routine healing 10/14/2017   Past Medical  History:  Diagnosis Date  . Hypertension    Past Surgical History:  Procedure Laterality Date  . WISDOM TOOTH EXTRACTION     Allergies  Allergen Reactions  . Ace Inhibitors Cough   Prior to Admission medications   Medication Sig Start Date End Date Taking? Authorizing Provider  hydrochlorothiazide (MICROZIDE) 12.5 MG capsule TAKE 1 CAPSULE DAILY 07/07/18  Yes [name], MD  losartan (COZAAR) 50 MG tablet TAKE 1 TABLET DAILY 07/07/18  Yes [name], MD   Social History   Socioeconomic History  . Marital status: Married    Spouse name: Not on file  . Number of children: Not on file  . Years of education: Not on file  . Highest education level: Not on file  Occupational History  . Not on file  Social Needs  . Financial resource strain: Not on file  . Food insecurity:    Worry: Not on file    Inability: Not on file  . Transportation needs:    Medical: Not on file    Non-medical: Not on file  Tobacco Use  . Smoking status: Never Smoker  . Smokeless tobacco: Never Used  Substance and Sexual Activity  . Alcohol use: Yes  . Drug use: No  . Sexual activity: Not on file  Lifestyle  . Physical activity:  Days per week: Not on file    Minutes per session: Not on file  . Stress: Not on file  Relationships  . Social connections:    Talks on phone: Not on file    Gets together: Not on file    Attends religious service: Not on file    Active member of club or organization: Not on file    Attends meetings of clubs or organizations: Not on file    Relationship status: Not on file  . Intimate partner violence:    Fear of current or ex partner: Not on file    Emotionally abused: Not on file    Physically abused: Not on file    Forced sexual activity: Not on file  Other Topics Concern  . Not on file  Social History Narrative  . Not on file    Review of Systems 13 point review of systems per patient health survey noted.  Negative other than as indicated  above or in HPI.      Objective:   Physical Exam Vitals signs reviewed.  Constitutional:      Appearance: He is well-developed.  HENT:     Head: Normocephalic and atraumatic.     Right Ear: External ear normal.     Left Ear: External ear normal.  Eyes:     Conjunctiva/sclera: Conjunctivae normal.     Pupils: Pupils are equal, round, and reactive to light.  Neck:     Musculoskeletal: Normal range of motion and neck supple.     Thyroid: No thyromegaly.  Cardiovascular:     Rate and Rhythm: Normal rate and regular rhythm.     Heart sounds: Normal heart sounds.  Pulmonary:     Effort: Pulmonary effort is normal. No respiratory distress.     Breath sounds: Normal breath sounds. No wheezing.  Abdominal:     General: There is no distension.     Palpations: Abdomen is soft.     Tenderness: There is no abdominal tenderness.  Musculoskeletal: Normal range of motion.        General: No tenderness.  Lymphadenopathy:     Cervical: No cervical adenopathy.  Skin:    General: Skin is warm and dry.  Neurological:     Mental Status: He is alert and oriented to person, place, and time.     Deep Tendon Reflexes: Reflexes are normal and symmetric.  Psychiatric:        Behavior: Behavior normal.    Vitals:   08/04/18 1330 08/04/18 1338  BP: (!) 155/90 (!) 144/70  Pulse: 81   Temp: 98.4 F (36.9 C)   TempSrc: Oral   SpO2: 98%   Weight: 242 lb 3.2 oz (109.9 kg)   Height: 5\' 10"  (1.778 m)          Assessment & Plan:   Cameron Norris is a 37 y.o. male Annual physical exam  - -anticipatory guidance as below in AVS, screening labs above. Health maintenance items as above in HPI discussed/recommended as applicable.   Hyperlipidemia, unspecified hyperlipidemia type - Plan: Lipid panel, Comprehensive metabolic panel  -Check labs to determine statin need versus diet/exercise approach  Essential hypertension - Plan: losartan (COZAAR) 100 MG tablet, hydrochlorothiazide (MICROZIDE) 12.5  MG capsule  -Uncontrolled.  Some borderline high readings at home as well.  Increase losartan to 100 mg daily, continue HCTZ 12.5 mg daily.  Labs pending  Screening for HIV (human immunodeficiency virus) - Plan: HIV Antibody (routine testing w rflx)  BMI  34.0-34.9,adult  -Goal BMI less than 30 initially.  Discussed diet and exercise most days per week, 150 minutes/week as a goal.  Meds ordered this encounter  Medications  . losartan (COZAAR) 100 MG tablet    Sig: Take 1 tablet (100 mg total) by mouth daily.    Dispense:  90 tablet    Refill:  2  . hydrochlorothiazide (MICROZIDE) 12.5 MG capsule    Sig: Take 1 capsule (12.5 mg total) by mouth daily.    Dispense:  90 capsule    Refill:  2    Office visit needed for refills   Patient Instructions   Increase losartan to 100 mg/day, continue hydrochlorothiazide same dose.  Monitor home blood pressures, expect this to improve with current regimen.  If any new side effects let me know.  Increase exercise to most days per week if possible with a goal of 150 minutes/week. Goal BMI would be below 30.   Keeping you healthy  Get these tests  Blood pressure- Have your blood pressure checked once a year by your healthcare provider.  Normal blood pressure is 120/80.  Weight- Have your body mass index (BMI) calculated to screen for obesity.  BMI is a measure of body fat based on height and weight. You can also calculate your own BMI at https://www.west-esparza.com/www.nhlbisupport.com/bmi/.  Cholesterol- Have your cholesterol checked regularly starting at age 36, sooner may be necessary if you have diabetes, high blood pressure, if a family member developed heart diseases at an early age or if you smoke.   Chlamydia, HIV, and other sexual transmitted disease- Get screened each year until the age of 36 then within three months of each new sexual partner.  Diabetes- Have your blood sugar checked regularly if you have high blood pressure, high cholesterol, a family history of  diabetes or if you are overweight.  Get these vaccines  Flu shot- Every fall.  Tetanus shot- Every 10 years.  Menactra- Single dose; prevents meningitis.  Take these steps  Don't smoke- If you do smoke, ask your healthcare provider about quitting. For tips on how to quit, go to www.smokefree.gov or call 1-800-QUIT-NOW.  Be physically active- Exercise 5 days a week for at least 30 minutes.  If you are not already physically active start slow and gradually work up to 30 minutes of moderate physical activity.  Examples of moderate activity include walking briskly, mowing the yard, dancing, swimming bicycling, etc.  Eat a healthy diet- Eat a variety of healthy foods such as fruits, vegetables, low fat milk, low fat cheese, yogurt, lean meats, poultry, fish, beans, tofu, etc.  For more information on healthy eating, go to www.thenutritionsource.org  Drink alcohol in moderation- Limit alcohol intake two drinks or less a day.  Never drink and drive.  Dentist- Brush and floss teeth twice daily; visit your dentis twice a year.  Depression-Your emotional health is as important as your physical health.  If you're feeling down, losing interest in things you normally enjoy please talk with your healthcare provider.  Gun Safety- If you keep a gun in your home, keep it unloaded and with the safety lock on.  Bullets should be stored separately.  Helmet use- Always wear a helmet when riding a motorcycle, bicycle, rollerblading or skateboarding.  Safe sex- If you may be exposed to a sexually transmitted infection, use a condom  Seat belts- Seat bels can save your life; always wear one.  Smoke/Carbon Monoxide detectors- These detectors need to be installed on the appropriate  level of your home.  Replace batteries at least once a year.  Skin Cancer- When out in the sun, cover up and use sunscreen SPF 15 or higher.  Violence- If anyone is threatening or hurting you, please tell your healthcare  provider.  If you have lab work done today you will be contacted with your lab results within the next 2 weeks.  If you have not heard from [place] then please contact us. The fastest way to get your results is to register for My Chart.   IF you received an x-ray today, you will receive an invoice from [facility]. Please contact [facility] at [phone] with questions or concerns regarding your invoice.   IF you received labwork today, you will receive an invoice from [place]. Please contact [facility] at [phone] with questions or concerns regarding your invoice.   Our billing staff will not be able to assist you with questions regarding bills from these companies.  You will be contacted with the lab results as soon as they are available. The fastest way to get your results is to activate your My Chart account. Instructions are located on the last page of this paperwork. If you have not heard from [place] regarding the results in 2 weeks, please contact this office.       Signed,   Meredith Staggers, MD [facility].  08/06/18 9:21 PM

## 2018-08-04 NOTE — Patient Instructions (Addendum)
Increase losartan to 100 mg/day, continue hydrochlorothiazide same dose.  Monitor home blood pressures, expect this to improve with current regimen.  If any new side effects let me know.  Increase exercise to most days per week if possible with a goal of 150 minutes/week. Goal BMI would be below 30.   Keeping you healthy  Get these tests  Blood pressure- Have your blood pressure checked once a year by your healthcare provider.  Normal blood pressure is 120/80.  Weight- Have your body mass index (BMI) calculated to screen for obesity.  BMI is a measure of body fat based on height and weight. You can also calculate your own BMI at https://www.west-esparza.com/.  Cholesterol- Have your cholesterol checked regularly starting at age 39, sooner may be necessary if you have diabetes, high blood pressure, if a family member developed heart diseases at an early age or if you smoke.   Chlamydia, HIV, and other sexual transmitted disease- Get screened each year until the age of 36 then within three months of each new sexual partner.  Diabetes- Have your blood sugar checked regularly if you have high blood pressure, high cholesterol, a family history of diabetes or if you are overweight.  Get these vaccines  Flu shot- Every fall.  Tetanus shot- Every 10 years.  Menactra- Single dose; prevents meningitis.  Take these steps  Don't smoke- If you do smoke, ask your healthcare provider about quitting. For tips on how to quit, go to www.smokefree.gov or call 1-800-QUIT-NOW.  Be physically active- Exercise 5 days a week for at least 30 minutes.  If you are not already physically active start slow and gradually work up to 30 minutes of moderate physical activity.  Examples of moderate activity include walking briskly, mowing the yard, dancing, swimming bicycling, etc.  Eat a healthy diet- Eat a variety of healthy foods such as fruits, vegetables, low fat milk, low fat cheese, yogurt, lean meats, poultry,  fish, beans, tofu, etc.  For more information on healthy eating, go to www.thenutritionsource.org  Drink alcohol in moderation- Limit alcohol intake two drinks or less a day.  Never drink and drive.  Dentist- Brush and floss teeth twice daily; visit your dentis twice a year.  Depression-Your emotional health is as important as your physical health.  If you're feeling down, losing interest in things you normally enjoy please talk with your healthcare provider.  Gun Safety- If you keep a gun in your home, keep it unloaded and with the safety lock on.  Bullets should be stored separately.  Helmet use- Always wear a helmet when riding a motorcycle, bicycle, rollerblading or skateboarding.  Safe sex- If you may be exposed to a sexually transmitted infection, use a condom  Seat belts- Seat bels can save your life; always wear one.  Smoke/Carbon Monoxide detectors- These detectors need to be installed on the appropriate level of your home.  Replace batteries at least once a year.  Skin Cancer- When out in the sun, cover up and use sunscreen SPF 15 or higher.  Violence- If anyone is threatening or hurting you, please tell your healthcare provider.  If you have lab work done today you will be contacted with your lab results within the next 2 weeks.  If you have not heard from [place] then please contact us. The fastest way to get your results is to register for My Chart.   IF you received an x-ray today, you will receive an invoice from [facility]. Please contact [facility]  at [phone] with questions or concerns regarding your invoice.   IF you received labwork today, you will receive an invoice from [facility]. Please contact [facility] at [phone] with questions or concerns regarding your invoice.   Our billing staff will not be able to assist you with questions regarding bills from these companies.  You will be contacted with the lab results as soon as they are available.  The fastest way to get your results is to activate your My Chart account. Instructions are located on the last page of this paperwork. If you have not heard from us regarding the results in 2 weeks, please contact this office.

## 2018-08-05 LAB — COMPREHENSIVE METABOLIC PANEL
ALBUMIN: 4.9 g/dL (ref 3.5–5.5)
ALT: 53 IU/L — ABNORMAL HIGH (ref 0–44)
AST: 30 IU/L (ref 0–40)
Albumin/Globulin Ratio: 1.8 (ref 1.2–2.2)
Alkaline Phosphatase: 53 IU/L (ref 39–117)
BILIRUBIN TOTAL: 0.5 mg/dL (ref 0.0–1.2)
BUN / CREAT RATIO: 12 (ref 9–20)
BUN: 10 mg/dL (ref 6–20)
CALCIUM: 9.8 mg/dL (ref 8.7–10.2)
CHLORIDE: 99 mmol/L (ref 96–106)
CO2: 22 mmol/L (ref 20–29)
Creatinine, Ser: 0.86 mg/dL (ref 0.76–1.27)
GFR, EST AFRICAN AMERICAN: 129 mL/min/{1.73_m2} (ref >59)
GFR, EST NON AFRICAN AMERICAN: 112 mL/min/{1.73_m2} (ref >59)
GLUCOSE: 99 mg/dL (ref 65–99)
Globulin, Total: 2.7 g/dL (ref 1.5–4.5)
Potassium: 4 mmol/L (ref 3.5–5.2)
Sodium: 138 mmol/L (ref 134–144)
TOTAL PROTEIN: 7.6 g/dL (ref 6.0–8.5)

## 2018-08-05 LAB — LIPID PANEL
Chol/HDL Ratio: 4.1 ratio (ref 0.0–5.0)
Cholesterol, Total: 205 mg/dL — ABNORMAL HIGH (ref 100–199)
HDL: 50 mg/dL (ref >39)
LDL Calculated: 136 mg/dL — ABNORMAL HIGH (ref 0–99)
TRIGLYCERIDES: 95 mg/dL (ref 0–149)
VLDL Cholesterol Cal: 19 mg/dL (ref 5–40)

## 2018-08-05 LAB — HIV ANTIBODY (ROUTINE TESTING W REFLEX): HIV Screen 4th Generation wRfx: NONREACTIVE

## 2018-12-29 ENCOUNTER — Encounter: Payer: Self-pay | Admitting: Family Medicine

## 2019-01-12 DIAGNOSIS — Z03818 Encounter for observation for suspected exposure to other biological agents ruled out: Secondary | ICD-10-CM | POA: Diagnosis not present

## 2019-04-14 ENCOUNTER — Other Ambulatory Visit: Payer: Self-pay | Admitting: Family Medicine

## 2019-04-14 DIAGNOSIS — I10 Essential (primary) hypertension: Secondary | ICD-10-CM

## 2019-04-14 NOTE — Telephone Encounter (Signed)
Forwarding medication refill request to the clinical pool for review. 

## 2019-06-08 ENCOUNTER — Encounter: Payer: Self-pay | Admitting: Family Medicine

## 2019-06-09 ENCOUNTER — Other Ambulatory Visit: Payer: Self-pay | Admitting: Family Medicine

## 2019-06-09 DIAGNOSIS — I10 Essential (primary) hypertension: Secondary | ICD-10-CM

## 2019-06-09 NOTE — Telephone Encounter (Signed)
Forwarding medication refill request to the clinical pool for review. 

## 2019-06-11 ENCOUNTER — Telehealth: Payer: Self-pay | Admitting: *Deleted

## 2019-06-11 NOTE — Telephone Encounter (Signed)
30 days were sent in .  Please advise patient no more refills can be called in until he is seen .

## 2019-06-11 NOTE — Telephone Encounter (Signed)
lvmtcb

## 2019-07-08 ENCOUNTER — Other Ambulatory Visit: Payer: Self-pay | Admitting: Family Medicine

## 2019-07-08 DIAGNOSIS — I10 Essential (primary) hypertension: Secondary | ICD-10-CM

## 2019-07-17 DIAGNOSIS — Z20828 Contact with and (suspected) exposure to other viral communicable diseases: Secondary | ICD-10-CM | POA: Diagnosis not present

## 2019-07-23 ENCOUNTER — Encounter: Payer: Self-pay | Admitting: Family Medicine

## 2019-07-23 ENCOUNTER — Ambulatory Visit: Payer: BC Managed Care – PPO | Admitting: Family Medicine

## 2019-07-23 ENCOUNTER — Other Ambulatory Visit: Payer: Self-pay

## 2019-07-23 VITALS — BP 148/93 | HR 62 | Temp 98.6°F | Resp 12 | Ht 70.0 in | Wt 248.0 lb

## 2019-07-23 DIAGNOSIS — E785 Hyperlipidemia, unspecified: Secondary | ICD-10-CM | POA: Diagnosis not present

## 2019-07-23 DIAGNOSIS — Z6835 Body mass index (BMI) 35.0-35.9, adult: Secondary | ICD-10-CM

## 2019-07-23 DIAGNOSIS — I10 Essential (primary) hypertension: Secondary | ICD-10-CM | POA: Diagnosis not present

## 2019-07-23 MED ORDER — LOSARTAN POTASSIUM 100 MG PO TABS: 100.0000 mg | ORAL_TABLET | Freq: Every day | ORAL | 2 refills | Status: DC

## 2019-07-23 MED ORDER — HYDROCHLOROTHIAZIDE 25 MG PO TABS: 25.0000 mg | ORAL_TABLET | Freq: Every day | ORAL | 2 refills | Status: DC

## 2019-07-23 NOTE — Patient Instructions (Addendum)
Continue losartan 100 mg each day, try higher dose of hydrochlorothiazide at 25 mg once per day.  Send me an update with some of your home readings in the next few weeks if you can by my chart.  Follow-up in 6 months for repeat lab work and a physical.  Depending on cholesterol we can also discuss other treatments if needed, but try working on portion control with evening meal, keep up the good work with exercise as well as avoidance of sugar-containing beverages.  Thank you for coming in today.       If you have lab work done today you will be contacted with your lab results within the next 2 weeks.  If you have not heard from [place] then please contact us. The fastest way to get your results is to register for My Chart.   IF you received an x-ray today, you will receive an invoice from [facility]. Please contact [facility] at [phone] with questions or concerns regarding your invoice.   IF you received labwork today, you will receive an invoice from [place]. Please contact [facility] at [phone] with questions or concerns regarding your invoice.   Our billing staff will not be able to assist you with questions regarding bills from these companies.  You will be contacted with the lab results as soon as they are available. The fastest way to get your results is to activate your My Chart account. Instructions are located on the last page of this paperwork. If you have not heard from [place] regarding the results in 2 weeks, please contact this office.

## 2019-07-23 NOTE — Progress Notes (Signed)
Subjective:  Patient ID: Cameron Norris, male    DOB: Mar 21, 1981  Age: 38 y.o. MRN: [id number]  CC:  Chief Complaint  Patient presents with  . Medication Refill  . Hypertension    HPI [name] presents for   Hypertension: Last discussed in December 2019.  At that time blood pressure was 144/70, but home readings in the 130s over 80s.  Was taking losartan 50 mg, HCTZ 12.5 mg.  Losartan increased to 100 mg daily.  Some planned restart of travel with work.  Wearing mask all the time.   Home readings: 130-140/88-91.  No new side effects with meds.  Rare missed dose - less than 1 time per month. BP Readings from Last 3 Encounters:  07/23/19 (!) 148/93  08/04/18 (!) 144/70  12/15/17 (!) 142/82   Lab Results  Component Value Date   CREATININE 0.86 08/04/2018    Hyperlipidemia: Mildly elevated in December 2019, plan for diet changes, activity/exercise. Walking 3 days per week - 45 min.  Fast food on the road only - 3-4 per month.  No soda/sweet tea.  Dinner at 8-830., largest meal. Small breakfast.  Ordered treadmill this am. Plans on more exercise.   Wt Readings from Last 3 Encounters:  07/23/19 248 lb (112.5 kg)  08/04/18 242 lb 3.2 oz (109.9 kg)  12/15/17 245 lb 6.4 oz (111.3 kg)  Body mass index is 35.58 kg/m.   Lab Results  Component Value Date   CHOL 205 (H) 08/04/2018   HDL 50 08/04/2018   LDLCALC 136 (H) 08/04/2018   TRIG 95 08/04/2018   CHOLHDL 4.1 08/04/2018   Lab Results  Component Value Date   ALT 53 (H) 08/04/2018   AST 30 08/04/2018   ALKPHOS 53 08/04/2018   BILITOT 0.5 08/04/2018      History Patient Active Problem List   Diagnosis Date Noted  . Closed nondisplaced fracture of neck of left radius 10/14/2017  . Closed nondisplaced fracture of head of radius with routine healing 10/14/2017   Past Medical History:  Diagnosis Date  . Hypertension    Past Surgical History:  Procedure Laterality Date  . WISDOM TOOTH EXTRACTION      Allergies  Allergen Reactions  . Ace Inhibitors Cough   Prior to Admission medications   Medication Sig Start Date End Date Taking? Authorizing Provider  hydrochlorothiazide (MICROZIDE) 12.5 MG capsule TAKE 1 CAPSULE DAILY (NEED OFFICE VISIT FOR REFILLS ) 06/11/19  Yes Wendie Agreste, MD  losartan (COZAAR) 100 MG tablet TAKE 1 TABLET DAILY 04/16/19  Yes Wendie Agreste, MD   Social History   Socioeconomic History  . Marital status: Married    Spouse name: Not on file  . Number of children: Not on file  . Years of education: Not on file  . Highest education level: Not on file  Occupational History  . Not on file  Social Needs  . Financial resource strain: Not on file  . Food insecurity    Worry: Not on file    Inability: Not on file  . Transportation needs    Medical: Not on file    Non-medical: Not on file  Tobacco Use  . Smoking status: Never Smoker  . Smokeless tobacco: Never Used  Substance and Sexual Activity  . Alcohol use: Yes    Comment: occ  . Drug use: No  . Sexual activity: Not on file  Lifestyle  . Physical activity    Days per week: Not on file  Minutes per session: Not on file  . Stress: Not on file  Relationships  . Social Musician on phone: Not on file    Gets together: Not on file    Attends religious service: Not on file    Active member of club or organization: Not on file    Attends meetings of clubs or organizations: Not on file    Relationship status: Not on file  . Intimate partner violence    Fear of current or ex partner: Not on file    Emotionally abused: Not on file    Physically abused: Not on file    Forced sexual activity: Not on file  Other Topics Concern  . Not on file  Social History Narrative  . Not on file    Review of Systems  Constitutional: Negative for fatigue and unexpected weight change.  Eyes: Negative for visual disturbance.  Respiratory: Negative for cough, chest tightness and shortness of  breath.   Cardiovascular: Negative for chest pain, palpitations and leg swelling.  Gastrointestinal: Negative for abdominal pain and blood in stool.  Neurological: Negative for dizziness, light-headedness and headaches.     Objective:   Vitals:   07/23/19 1123  BP: (!) 148/93  Pulse: 62  Resp: 12  Temp: 98.6 F (37 C)  SpO2: 98%  Weight: 248 lb (112.5 kg)  Height: 5\' 10"  (1.778 m)     Physical Exam Vitals signs reviewed.  Constitutional:      Appearance: He is well-developed.  HENT:     Head: Normocephalic and atraumatic.  Eyes:     Pupils: Pupils are equal, round, and reactive to light.  Neck:     Vascular: No carotid bruit or JVD.  Cardiovascular:     Rate and Rhythm: Normal rate and regular rhythm.     Heart sounds: Normal heart sounds. No murmur.  Pulmonary:     Effort: Pulmonary effort is normal.     Breath sounds: Normal breath sounds. No rales.  Skin:    General: Skin is warm and dry.  Neurological:     Mental Status: He is alert and oriented to person, place, and time.     Assessment & Plan:  Granvil Djordjevic is a 38 y.o. male . Hyperlipidemia, unspecified hyperlipidemia type - Plan: Comprehensive metabolic panel, Lipid panel  -Commended on exercise/Increased activity, check lipids.  Essential hypertension - Plan: losartan (COZAAR) 100 MG tablet, hydrochlorothiazide (HYDRODIURIL) 25 MG tablet, Comprehensive metabolic panel  -Decreased control, will continue losartan 100 mg daily, increase HCTZ to 25 mg daily, potential side effects discussed.  Recheck in 6 months, my chart update next few weeks.  BMI 35.0-35.9,adult  -Portion control discussed with dinner, continue to avoid sugar-containing beverages, can increase activity/exercise to most days per week but commended on current work.  Recheck 6 months.  Meds ordered this encounter  Medications  . losartan (COZAAR) 100 MG tablet    Sig: Take 1 tablet (100 mg total) by mouth daily.    Dispense:  90 tablet     Refill:  2  . hydrochlorothiazide (HYDRODIURIL) 25 MG tablet    Sig: Take 1 tablet (25 mg total) by mouth daily.    Dispense:  90 tablet    Refill:  2   Patient Instructions   Continue losartan 100 mg each day, try higher dose of hydrochlorothiazide at 25 mg once per day.  Send me an update with some of your home readings in the next few weeks  if you can by my chart.  Follow-up in 6 months for repeat lab work and a physical.  Depending on cholesterol we can also discuss other treatments if needed, but try working on portion control with evening meal, keep up the good work with exercise as well as avoidance of sugar-containing beverages.  Thank you for coming in today.       If you have lab work done today you will be contacted with your lab results within the next 2 weeks.  If you have not heard from us then please contact us. The fastest way to get your results is to register for My Chart.   IF you received an x-ray today, you will receive an invoice from [facility]. Please contact [facility] at [phone] with questions or concerns regarding your invoice.   IF you received labwork today, you will receive an invoice from [facility]. Please contact [facility] at [phone] with questions or concerns regarding your invoice.   Our billing staff will not be able to assist you with questions regarding bills from these companies.  You will be contacted with the lab results as soon as they are available. The fastest way to get your results is to activate your My Chart account. Instructions are located on the last page of this paperwork. If you have not heard from us regarding the results in 2 weeks, please contact this office.           Signed, Meredith StaggersJeffrey Shariyah Eland, MD [facility]

## 2019-07-24 LAB — LIPID PANEL
Chol/HDL Ratio: 3.4 ratio (ref 0.0–5.0)
Cholesterol, Total: 224 mg/dL — ABNORMAL HIGH (ref 100–199)
HDL: 65 mg/dL (ref >39)
LDL Chol Calc (NIH): 140 mg/dL — ABNORMAL HIGH (ref 0–99)
Triglycerides: 108 mg/dL (ref 0–149)
VLDL Cholesterol Cal: 19 mg/dL (ref 5–40)

## 2019-07-24 LAB — COMPREHENSIVE METABOLIC PANEL
ALT: 88 IU/L — ABNORMAL HIGH (ref 0–44)
AST: 50 IU/L — ABNORMAL HIGH (ref 0–40)
Albumin/Globulin Ratio: 1.8 (ref 1.2–2.2)
Albumin: 5.1 g/dL — ABNORMAL HIGH (ref 4.0–5.0)
Alkaline Phosphatase: 60 IU/L (ref 39–117)
BUN/Creatinine Ratio: 12 (ref 9–20)
BUN: 11 mg/dL (ref 6–20)
Bilirubin Total: 0.6 mg/dL (ref 0.0–1.2)
CO2: 21 mmol/L (ref 20–29)
Calcium: 10 mg/dL (ref 8.7–10.2)
Chloride: 101 mmol/L (ref 96–106)
Creatinine, Ser: 0.93 mg/dL (ref 0.76–1.27)
GFR calc Af Amer: 121 mL/min/{1.73_m2} (ref >59)
GFR calc non Af Amer: 105 mL/min/{1.73_m2} (ref >59)
Globulin, Total: 2.8 g/dL (ref 1.5–4.5)
Glucose: 100 mg/dL — ABNORMAL HIGH (ref 65–99)
Potassium: 4.9 mmol/L (ref 3.5–5.2)
Sodium: 140 mmol/L (ref 134–144)
Total Protein: 7.9 g/dL (ref 6.0–8.5)

## 2019-08-22 ENCOUNTER — Encounter: Payer: Self-pay | Admitting: Family Medicine

## 2019-08-22 DIAGNOSIS — Z20828 Contact with and (suspected) exposure to other viral communicable diseases: Secondary | ICD-10-CM | POA: Diagnosis not present

## 2019-08-22 DIAGNOSIS — U071 COVID-19: Secondary | ICD-10-CM | POA: Diagnosis not present

## 2019-09-04 DIAGNOSIS — U071 COVID-19: Secondary | ICD-10-CM | POA: Diagnosis not present

## 2019-09-17 ENCOUNTER — Other Ambulatory Visit: Payer: Self-pay

## 2019-09-17 ENCOUNTER — Emergency Department (HOSPITAL_COMMUNITY): Payer: BC Managed Care – PPO

## 2019-09-17 ENCOUNTER — Encounter (HOSPITAL_COMMUNITY): Payer: Self-pay | Admitting: Emergency Medicine

## 2019-09-17 ENCOUNTER — Emergency Department (HOSPITAL_COMMUNITY)
Admission: EM | Admit: 2019-09-17 | Discharge: 2019-09-17 | Disposition: A | Discharge status: Home / Self Care | Payer: BC Managed Care – PPO | Attending: Emergency Medicine | Admitting: Emergency Medicine

## 2019-09-17 DIAGNOSIS — I10 Essential (primary) hypertension: Secondary | ICD-10-CM | POA: Insufficient documentation

## 2019-09-17 DIAGNOSIS — I160 Hypertensive urgency: Secondary | ICD-10-CM | POA: Diagnosis not present

## 2019-09-17 DIAGNOSIS — R002 Palpitations: Secondary | ICD-10-CM | POA: Diagnosis not present

## 2019-09-17 HISTORY — DX: COVID-19: U07.1

## 2019-09-17 LAB — COMPREHENSIVE METABOLIC PANEL
ALT: 79 U/L — ABNORMAL HIGH (ref 0–44)
AST: 42 U/L — ABNORMAL HIGH (ref 15–41)
Albumin: 4.4 g/dL (ref 3.5–5.0)
Alkaline Phosphatase: 53 U/L (ref 38–126)
Anion gap: 12 (ref 5–15)
BUN: 13 mg/dL (ref 6–20)
CO2: 23 mmol/L (ref 22–32)
Calcium: 9.4 mg/dL (ref 8.9–10.3)
Chloride: 104 mmol/L (ref 98–111)
Creatinine, Ser: 0.86 mg/dL (ref 0.61–1.24)
GFR calc Af Amer: >60 mL/min (ref >60)
GFR calc non Af Amer: >60 mL/min (ref >60)
Glucose, Bld: 92 mg/dL (ref 70–99)
Potassium: 3.6 mmol/L (ref 3.5–5.1)
Sodium: 139 mmol/L (ref 135–145)
Total Bilirubin: 0.6 mg/dL (ref 0.3–1.2)
Total Protein: 7.5 g/dL (ref 6.5–8.1)

## 2019-09-17 LAB — CBC WITH DIFFERENTIAL/PLATELET
Abs Immature Granulocytes: 0.12 10*3/uL — ABNORMAL HIGH (ref 0.00–0.07)
Basophils Absolute: 0.1 10*3/uL (ref 0.0–0.1)
Basophils Relative: 1 %
Eosinophils Absolute: 0.1 10*3/uL (ref 0.0–0.5)
Eosinophils Relative: 1 %
HCT: 44.6 % (ref 39.0–52.0)
Hemoglobin: 15.3 g/dL (ref 13.0–17.0)
Immature Granulocytes: 2 %
Lymphocytes Relative: 25 %
Lymphs Abs: 1.8 10*3/uL (ref 0.7–4.0)
MCH: 30.8 pg (ref 26.0–34.0)
MCHC: 34.3 g/dL (ref 30.0–36.0)
MCV: 89.9 fL (ref 80.0–100.0)
Monocytes Absolute: 1 10*3/uL (ref 0.1–1.0)
Monocytes Relative: 14 %
Neutro Abs: 4 10*3/uL (ref 1.7–7.7)
Neutrophils Relative %: 57 %
Platelets: 237 10*3/uL (ref 150–400)
RBC: 4.96 MIL/uL (ref 4.22–5.81)
RDW: 13.4 % (ref 11.5–15.5)
WBC: 7 10*3/uL (ref 4.0–10.5)
nRBC: 0 % (ref 0.0–0.2)

## 2019-09-17 LAB — TSH: TSH: 1.378 u[IU]/mL (ref 0.350–4.500)

## 2019-09-17 LAB — MAGNESIUM: Magnesium: 1.9 mg/dL (ref 1.7–2.4)

## 2019-09-17 NOTE — ED Provider Notes (Signed)
[facility] EMERGENCY DEPARTMENT Provider Note   CSN: [id number] Arrival date & time: 09/17/19  1501     History Chief Complaint  Patient presents with  . Hypertension    Cameron Norris is a 39 y.o. male.  HPI 39 year old male with history of hypertension, hyperlipidemia, presenting to the emergency department for intermittent heart palpitations that began approximately 1 hour prior to presentation.  Patient states that he was getting out of the shower when he noticed his heart was fluttering, states that he had no pain no shortness of breath, no other symptoms.  Says it was very self-limited, denies any fevers or chills, no nausea or vomiting or abdominal pain.  Has no history of coronary disease, denies any leg swelling, no worsening dyspnea on exertion, patient has history of hypertension on 2 drugs currently losartan hydrochlorothiazide.  Patient states that there was no discomfort or pressure, no radiation of any type of pain.  Currently asymptomatic and states that he feels well.  Patient states that his blood pressures are usually well controlled in the 130s and 140 systolic at home, he was recently seen by his PCP a few months ago for medication titration in the setting of some poorly controlled readings at home however he states that for the past few months he has been well controlled.  No diaphoresis, no nausea, no history of heart attack or stroke.    Past Medical History:  Diagnosis Date  . COVID-19   . Hypertension     Patient Active Problem List   Diagnosis Date Noted  . Closed nondisplaced fracture of neck of left radius 10/14/2017  . Closed nondisplaced fracture of head of radius with routine healing 10/14/2017    Past Surgical History:  Procedure Laterality Date  . WISDOM TOOTH EXTRACTION         Family History  Problem Relation Age of Onset  . Colon cancer Maternal Grandmother     Social History   Tobacco Use  . Smoking status: Never  Smoker  . Smokeless tobacco: Never Used  Substance Use Topics  . Alcohol use: Yes    Comment: occ  . Drug use: No    Home Medications Prior to Admission medications   Medication Sig Start Date End Date Taking? Authorizing Provider  hydrochlorothiazide (HYDRODIURIL) 25 MG tablet Take 1 tablet (25 mg total) by mouth daily. 07/23/19   [name], MD  losartan (COZAAR) 100 MG tablet Take 1 tablet (100 mg total) by mouth daily. 07/23/19   [name], MD    Allergies    Ace inhibitors  Review of Systems   Review of Systems  Constitutional: Negative for chills and fever.  HENT: Negative for ear pain and sore throat.   Eyes: Negative for pain and visual disturbance.  Respiratory: Negative for cough and shortness of breath.   Cardiovascular: Positive for palpitations. Negative for chest pain.  Gastrointestinal: Negative for abdominal pain and vomiting.  Genitourinary: Negative for dysuria and hematuria.  Musculoskeletal: Negative for arthralgias and back pain.  Skin: Negative for color change and rash.  Neurological: Negative for seizures and syncope.  All other systems reviewed and are negative.   Physical Exam Updated Vital Signs BP (!) 161/88   Pulse (!) 104   Temp 98.1 F (36.7 C) (Oral)   Resp 20   SpO2 97%   Physical Exam Vitals and nursing note reviewed.  Constitutional:      General: He is not in acute distress.  Appearance: He is well-developed. He is not ill-appearing, toxic-appearing or diaphoretic.  HENT:     Head: Normocephalic and atraumatic.     Right Ear: External ear normal.     Left Ear: External ear normal.     Nose: Nose normal. No congestion.     Mouth/Throat:     Mouth: Mucous membranes are moist.     Pharynx: Oropharynx is clear.  Eyes:     Conjunctiva/sclera: Conjunctivae normal.  Cardiovascular:     Rate and Rhythm: Regular rhythm. Tachycardia present.     Heart sounds: No murmur.  Pulmonary:     Effort: Pulmonary effort is  normal. No respiratory distress.     Breath sounds: Normal breath sounds. No stridor. No wheezing or rhonchi.  Abdominal:     Palpations: Abdomen is soft.     Tenderness: There is no abdominal tenderness.  Musculoskeletal:        General: No swelling or tenderness. Normal range of motion.     Cervical back: Neck supple.  Skin:    General: Skin is warm and dry.     Capillary Refill: Capillary refill takes less than 2 seconds.  Neurological:     General: No focal deficit present.     Mental Status: He is alert.  Psychiatric:        Mood and Affect: Mood normal.        Behavior: Behavior normal.     ED Results / Procedures / Treatments   Labs (all labs ordered are listed, but only abnormal results are displayed) Labs Reviewed  CBC WITH DIFFERENTIAL/PLATELET - Abnormal; Notable for the following components:      Result Value   Abs Immature Granulocytes 0.12 (*)    All other components within normal limits  COMPREHENSIVE METABOLIC PANEL - Abnormal; Notable for the following components:   AST 42 (*)    ALT 79 (*)    All other components within normal limits  MAGNESIUM  TSH    EKG EKG Interpretation  Date/Time:  Monday September 17 2019 15:08:41 EST Ventricular Rate:  89 PR Interval:  156 QRS Duration: 92 QT Interval:  364 QTC Calculation: 442 R Axis:   45 Text Interpretation: Normal sinus rhythm Normal ECG No previous tracing Confirmed by Gwyneth Sprout ([id number]) on 09/17/2019 3:45:15 PM   Radiology DG Chest Portable 1 View  Result Date: 09/17/2019 CLINICAL DATA:  Cardiac palpitations and hypertension EXAM: PORTABLE CHEST 1 VIEW COMPARISON:  None. FINDINGS: Lungs are clear. Heart is borderline enlarged with pulmonary vascularity normal. No adenopathy. No pneumothorax. No bone lesions. IMPRESSION: Borderline cardiac enlargement.  Lungs clear.  No adenopathy. Electronically Signed   By: Bretta Bang III M.D.   On: 09/17/2019 15:50    Procedures Procedures (including  critical care time)  Medications Ordered in ED Medications - No data to display  ED Course  I have reviewed the triage vital signs and the nursing notes.  Pertinent labs & imaging results that were available during my care of the patient were reviewed by me and considered in my medical decision making (see chart for details).    MDM Rules/Calculators/A&P                      39 year old male presenting to ED for palpitations.  On arrival patient was hypertensive in the low 160 systolic, mildly tachycardic, 623 otherwise well-appearing, afebrile, no tachypnea or hypoxia noted.  Patient currently states he is asymptomatic.  EKG with normal sinus  rhythm, no signs of acute ischemia, Doubt ACS given his history, and with a nonischemic EKG, will hold on troponin at this time.  Given patient is on diuretics, checked electrolytes which were reassuring, CBC reassuring as well, no anemia, hemoglobin stable.  Chest x-ray with no abnormalities noted on my review.  Patient will follow up with his PCP, for the symptoms, as well as titration of his blood pressure medications.  No signs of endorgan damage on his labs, doubt hypertensive emergency at this time.  On reassessment patient states that he is continuing to feel well, asymptomatic currently.  Doubt PE, given his history, no hypoxia or shortness of breath noted, currently no chest pain.  Patient may require Zio patch. Return precautions given, DC In good condition.   The attending physician was present and available for all medical decision making and procedures related to this patient's care.  Final Clinical Impression(s) / ED Diagnoses Final diagnoses:  Essential hypertension  Hypertensive urgency    Rx / DC Orders ED Discharge Orders    None       Kizzie Fantasia, MD 09/17/19 1719    Blanchie Dessert, MD 09/17/19 2118

## 2019-09-17 NOTE — ED Notes (Signed)
Patient verbalizes understanding of discharge instructions. Opportunity for questioning and answers were provided. Armband removed by staff, pt discharged from ED.  

## 2019-09-17 NOTE — ED Triage Notes (Signed)
Pt to triage via [facility].  C/o hypertension and intermittent heart palpitations x 40 min.  Denies chest pain.  Takes HCTZ and Losartan- dose increased 1 year ago.  CBG 107.  + COVID 12/29.

## 2019-09-20 ENCOUNTER — Other Ambulatory Visit: Payer: Self-pay

## 2019-09-20 ENCOUNTER — Encounter: Payer: Self-pay | Admitting: Family Medicine

## 2019-09-20 ENCOUNTER — Ambulatory Visit: Payer: BC Managed Care – PPO | Admitting: Family Medicine

## 2019-09-20 VITALS — BP 190/98 | HR 93 | Temp 98.7°F | Ht 70.0 in | Wt 243.0 lb

## 2019-09-20 DIAGNOSIS — I10 Essential (primary) hypertension: Secondary | ICD-10-CM

## 2019-09-20 DIAGNOSIS — I160 Hypertensive urgency: Secondary | ICD-10-CM

## 2019-09-20 DIAGNOSIS — F439 Reaction to severe stress, unspecified: Secondary | ICD-10-CM

## 2019-09-20 DIAGNOSIS — R002 Palpitations: Secondary | ICD-10-CM | POA: Diagnosis not present

## 2019-09-20 MED ORDER — AMLODIPINE BESYLATE 2.5 MG PO TABS: 2.5000 mg | ORAL_TABLET | Freq: Every day | ORAL | 1 refills | Status: DC

## 2019-09-20 NOTE — Patient Instructions (Addendum)
Keep up the good work with the [place].  Amlodipine 2.5 mg added to your allergy medicines for improved blood pressure control.  If any return of heart palpitations or spikes in blood pressure, return for recheck or emergency room if needed.  See information below on stress.  Let me know if I can help further.  Take care.   Return to the clinic or go to the nearest emergency room if any of your symptoms worsen or new symptoms occur.   Stress, Adult Stress is a normal reaction to life events. Stress is what you feel when life demands more than you are used to, or more than you think you can handle. Some stress can be useful, such as studying for a test or meeting a deadline at work. Stress that occurs too often or for too long can cause problems. It can affect your emotional health and interfere with relationships and normal daily activities. Too much stress can weaken your body's defense system (immune system) and increase your risk for physical illness. If you already have a medical problem, stress can make it worse. What are the causes? All sorts of life events can cause stress. An event that causes stress for one person may not be stressful for another person. Major life events, whether positive or negative, commonly cause stress. Examples include:  Losing a job or starting a new job.  Losing a loved one.  Moving to a new town or home.  Getting married or divorced.  Having a baby.  Getting injured or sick. Less obvious life events can also cause stress, especially if they occur day after day or in combination with each other. Examples include:  Working long hours.  Driving in traffic.  Caring for children.  Being in debt.  Being in a difficult relationship. What are the signs or symptoms? Stress can cause emotional symptoms, including:  Anxiety. This is feeling worried, afraid, on edge, overwhelmed, or out of control.  Anger, including irritation or  impatience.  Depression. This is feeling sad, down, helpless, or guilty.  Trouble focusing, remembering, or making decisions. Stress can cause physical symptoms, including:  Aches and pains. These may affect your head, neck, back, stomach, or other areas of your body.  Tight muscles or a clenched jaw.  Low energy.  Trouble sleeping. Stress can cause unhealthy behaviors, including:  Eating to feel better (overeating) or skipping meals.  Working too much or putting off tasks.  Smoking, drinking alcohol, or using drugs to feel better. How is this diagnosed? Stress is diagnosed through an assessment by your health care provider. He or she may diagnose this condition based on:  Your symptoms and any stressful life events.  Your medical history.  Tests to rule out other causes of your symptoms. Depending on your condition, your health care provider may refer you to a specialist for further evaluation. How is this treated?  Stress management techniques are the recommended treatment for stress. Medicine is not typically recommended for the treatment of stress. Techniques to reduce your reaction to stressful life events include:  Stress identification. Monitor yourself for symptoms of stress and identify what causes stress for you. These skills may help you to avoid or prepare for stressful events.  Time management. Set your priorities, keep a calendar of events, and learn to say no. Taking these actions can help you avoid making too many commitments. Techniques for coping with stress include:  Rethinking the problem. Try to think realistically about stressful events  rather than ignoring them or overreacting. Try to find the positives in a stressful situation rather than focusing on the negatives.  Exercise. Physical exercise can release both physical and emotional tension. The key is to find a form of exercise that you enjoy and do it regularly.  Relaxation techniques. These relax  the body and mind. The key is to find one or more that you enjoy and use the techniques regularly. Examples include: ? Meditation, deep breathing, or progressive relaxation techniques. ? Yoga or tai chi. ? Biofeedback, mindfulness techniques, or journaling. ? Listening to music, being out in nature, or participating in other hobbies.  Practicing a healthy lifestyle. Eat a balanced diet, drink plenty of water, limit or avoid caffeine, and get plenty of sleep.  Having a strong support network. Spend time with family, friends, or other people you enjoy being around. Express your feelings and talk things over with someone you trust. Counseling or talk therapy with a mental health professional may be helpful if you are having trouble managing stress on your own. Follow these instructions at home: Lifestyle   Avoid drugs.  Do not use any products that contain nicotine or tobacco, such as cigarettes, e-cigarettes, and chewing tobacco. If you need help quitting, ask your health care provider.  Limit alcohol intake to no more than 1 drink a day for nonpregnant women and 2 drinks a day for men. One drink equals 12 oz of beer, 5 oz of wine, or 1 oz of hard liquor  Do not use alcohol or drugs to relax.  Eat a balanced diet that includes fresh fruits and vegetables, whole grains, lean meats, fish, eggs, and beans, and low-fat dairy. Avoid processed foods and foods high in added fat, sugar, and salt.  Exercise at least 30 minutes on 5 or more days each week.  Get 7-8 hours of sleep each night. General instructions   Practice stress management techniques as discussed with your health care provider.  Drink enough fluid to keep your urine clear or pale yellow.  Take over-the-counter and prescription medicines only as told by your health care provider.  Keep all follow-up visits as told by your health care provider. This is important. Contact a health care provider if:  Your symptoms get  worse.  You have new symptoms.  You feel overwhelmed by your problems and can no longer manage them on your own. Get help right away if:  You have thoughts of hurting yourself or others. If you ever feel like you may hurt yourself or others, or have thoughts about taking your own life, get help right away. You can go to your nearest emergency department or call:  Your local emergency services (911 in the [place]  A suicide crisis helpline, such as the [facility] at 803-409-2679. This is open 24 hours a day. Summary  Stress is a normal reaction to life events. It can cause problems if it happens too often or for too long.  Practicing stress management techniques is the best way to treat stress.  Counseling or talk therapy with a mental health professional may be helpful if you are having trouble managing stress on your own. This information is not intended to replace advice given to you by your health care provider. Make sure you discuss any questions you have with your health care provider. Document Revised: 03/09/2019 Document Reviewed: 09/29/2016 Elsevier Patient Education  [employer].    If you have lab work done today you will  be contacted with your lab results within the next 2 weeks.  If you have not heard from [place] then please contact us. The fastest way to get your results is to register for My Chart.   IF you received an x-ray today, you will receive an invoice from [facility]. Please contact [facility] at [phone] with questions or concerns regarding your invoice.   IF you received labwork today, you will receive an invoice from [facility]. Please contact [facility] at [phone] with questions or concerns regarding your invoice.   Our billing staff will not be able to assist you with questions regarding bills from these companies.  You will be contacted with the lab results as soon as they are available. The fastest  way to get your results is to activate your My Chart account. Instructions are located on the last page of this paperwork. If you have not heard from [place] regarding the results in 2 weeks, please contact this office.

## 2019-09-20 NOTE — Progress Notes (Signed)
Subjective:  Patient ID: Cameron Norris, male    DOB: [birth year]  Age: 39 y.o. MRN: [id number]  CC:  Chief Complaint  Patient presents with  . Hospitalization Follow-up    pt states he had a panic attack on monday, and went to the E.D. pt stated that after showering monday afternoon he felt his heart racinging and fluttering. pt the checked his BP and it was at 187/111. pt states this is what trigered the panic attack. pt is concerened about his BP. pt states his job has been a key stressor in his life this past month.    HPI Cameron Norris presents for   Hypertension: Last discussed in November.  Losartan 100 mg, HCTZ 12.5 mg at that time.  home readings 130-140 over 80s to 90s.  HCTZ increased to 25 mg daily.' Evaluated in the ER 3 days ago after an episode of heart racing and fluttering while taking a shower.  Blood pressure reportedly 187/111.  Had what was described as a panic attack.  Does report stressor of increased stress with job over the past month.  ER note reviewed.  4 cups of coffee per day.  EKG normal sinus rhythm.  No chest pain no signs/symptoms concerning for ACS.  Normal TSH, cbc, magnesium, CMP with mild elevated LFTs but improved from previous readings.  Otherwise normal. EKG - NSR, no acute findings.  Chest x-ray with borderline cardiomegaly but no acute findings.  1 view chest x-ray.  Blood pressure 161/88 in the ER  No further palpitations, felt better after sleep Monday night.   Home readings: 130-140/90's past few days.  No missed doses.  Alcohol - last week few per day. None this week.  Stress mgt - walks, but interrupted with calls.  Depression screen [facility] 2/9 09/20/2019 07/23/2019 08/04/2018 12/15/2017 05/09/2017  Decreased Interest 0 0 0 0 0  Down, Depressed, Hopeless 0 0 0 0 0  PHQ - 2 Score 0 0 0 0 0   Working on [employer].   BP Readings from Last 3 Encounters:  09/20/19 (!) 190/98  09/17/19 (!) 161/88  07/23/19 (!) 148/93   Lab Results  Component  Value Date   CREATININE 0.86 09/17/2019      History Patient Active Problem List   Diagnosis Date Noted  . Closed nondisplaced fracture of neck of left radius 10/14/2017  . Closed nondisplaced fracture of head of radius with routine healing 10/14/2017   Past Medical History:  Diagnosis Date  . COVID-19   . Hypertension    Past Surgical History:  Procedure Laterality Date  . WISDOM TOOTH EXTRACTION     Allergies  Allergen Reactions  . Ace Inhibitors Cough   Prior to Admission medications   Medication Sig Start Date End Date Taking? Authorizing Provider  hydrochlorothiazide (HYDRODIURIL) 25 MG tablet Take 1 tablet (25 mg total) by mouth daily. 07/23/19  Yes Wendie Agreste, MD  losartan (COZAAR) 100 MG tablet Take 1 tablet (100 mg total) by mouth daily. 07/23/19  Yes Wendie Agreste, MD   Social History   Socioeconomic History  . Marital status: Married    Spouse name: Not on file  . Number of children: Not on file  . Years of education: Not on file  . Highest education level: Not on file  Occupational History  . Not on file  Tobacco Use  . Smoking status: Never Smoker  . Smokeless tobacco: Never Used  Substance and Sexual Activity  . Alcohol use: Yes  Comment: occ  . Drug use: No  . Sexual activity: Not on file  Other Topics Concern  . Not on file  Social History Narrative  . Not on file   Social Determinants of Health   Financial Resource Strain:   . Difficulty of Paying Living Expenses: Not on file  Food Insecurity:   . Worried About Charity fundraiser in the Last Year: Not on file  . Ran Out of Food in the Last Year: Not on file  Transportation Needs:   . Lack of Transportation (Medical): Not on file  . Lack of Transportation (Non-Medical): Not on file  Physical Activity:   . Days of Exercise per Week: Not on file  . Minutes of Exercise per Session: Not on file  Stress:   . Feeling of Stress : Not on file  Social Connections:   . Frequency  of Communication with Friends and Family: Not on file  . Frequency of Social Gatherings with Friends and Family: Not on file  . Attends Religious Services: Not on file  . Active Member of Clubs or Organizations: Not on file  . Attends Archivist Meetings: Not on file  . Marital Status: Not on file  Intimate Partner Violence:   . Fear of Current or Ex-Partner: Not on file  . Emotionally Abused: Not on file  . Physically Abused: Not on file  . Sexually Abused: Not on file    Review of Systems  Constitutional: Negative for fatigue and unexpected weight change.  Eyes: Negative for visual disturbance.  Respiratory: Negative for cough, chest tightness and shortness of breath.   Cardiovascular: Negative for chest pain, palpitations (resolved. ) and leg swelling.  Gastrointestinal: Negative for abdominal pain and blood in stool.  Neurological: Negative for dizziness, light-headedness and headaches.     Objective:   Vitals:   09/20/19 1036 09/20/19 1045  BP: (!) 155/90 (!) 190/98  Pulse: 93   Temp: 98.7 F (37.1 C)   TempSrc: Temporal   SpO2: 99%   Weight: 243 lb (110.2 kg)   Height: 5' 10"  (1.778 m)      Physical Exam Vitals reviewed.  Constitutional:      Appearance: He is well-developed.  HENT:     Head: Normocephalic and atraumatic.  Eyes:     Pupils: Pupils are equal, round, and reactive to light.  Neck:     Vascular: No carotid bruit or JVD.  Cardiovascular:     Rate and Rhythm: Normal rate and regular rhythm.     Heart sounds: Normal heart sounds. No murmur.  Pulmonary:     Effort: Pulmonary effort is normal.     Breath sounds: Normal breath sounds. No rales.  Skin:    General: Skin is warm and dry.  Neurological:     Mental Status: He is alert and oriented to person, place, and time.        Assessment & Plan:  Cameron Norris is a 39 y.o. male . Essential hypertension - Plan: amLODipine (NORVASC) 2.5 MG tablet  Hypertensive urgency - Plan:  amLODipine (NORVASC) 2.5 MG tablet  Palpitations  Situational stress  Possible multifactorial causes of hypertension, recent palpitations/urgency.  Situational stressors at work.  Previous alcohol use, now decreased.  Initial blood pressure  better on office visit today as well as home readings recently.  -Continue losartan, HCTZ same doses, add amlodipine 2.5 mg.  Continue to monitor home readings with RTC precautions.  -Also discussed stress management, limit setting, handout  given.  RTC precautions.  -Palpitations-resolved, RTC precautions if recurrence.  Potentially situational stress/anxiety attack.  No new medications at this time as we will try stress management initially.  Meds ordered this encounter  Medications  . amLODipine (NORVASC) 2.5 MG tablet    Sig: Take 1 tablet (2.5 mg total) by mouth daily.    Dispense:  90 tablet    Refill:  1   Patient Instructions   Keep up the good work with the [place].  Amlodipine 2.5 mg added to your allergy medicines for improved blood pressure control.  If any return of heart palpitations or spikes in blood pressure, return for recheck or emergency room if needed.  See information below on stress.  Let me know if I can help further.  Take care.   Return to the clinic or go to the nearest emergency room if any of your symptoms worsen or new symptoms occur.   Stress, Adult Stress is a normal reaction to life events. Stress is what you feel when life demands more than you are used to, or more than you think you can handle. Some stress can be useful, such as studying for a test or meeting a deadline at work. Stress that occurs too often or for too long can cause problems. It can affect your emotional health and interfere with relationships and normal daily activities. Too much stress can weaken your body's defense system (immune system) and increase your risk for physical illness. If you already have a medical problem, stress can make it  worse. What are the causes? All sorts of life events can cause stress. An event that causes stress for one person may not be stressful for another person. Major life events, whether positive or negative, commonly cause stress. Examples include:  Losing a job or starting a new job.  Losing a loved one.  Moving to a new town or home.  Getting married or divorced.  Having a baby.  Getting injured or sick. Less obvious life events can also cause stress, especially if they occur day after day or in combination with each other. Examples include:  Working long hours.  Driving in traffic.  Caring for children.  Being in debt.  Being in a difficult relationship. What are the signs or symptoms? Stress can cause emotional symptoms, including:  Anxiety. This is feeling worried, afraid, on edge, overwhelmed, or out of control.  Anger, including irritation or impatience.  Depression. This is feeling sad, down, helpless, or guilty.  Trouble focusing, remembering, or making decisions. Stress can cause physical symptoms, including:  Aches and pains. These may affect your head, neck, back, stomach, or other areas of your body.  Tight muscles or a clenched jaw.  Low energy.  Trouble sleeping. Stress can cause unhealthy behaviors, including:  Eating to feel better (overeating) or skipping meals.  Working too much or putting off tasks.  Smoking, drinking alcohol, or using drugs to feel better. How is this diagnosed? Stress is diagnosed through an assessment by your health care provider. He or she may diagnose this condition based on:  Your symptoms and any stressful life events.  Your medical history.  Tests to rule out other causes of your symptoms. Depending on your condition, your health care provider may refer you to a specialist for further evaluation. How is this treated?  Stress management techniques are the recommended treatment for stress. Medicine is not typically  recommended for the treatment of stress. Techniques to reduce your reaction to  stressful life events include:  Stress identification. Monitor yourself for symptoms of stress and identify what causes stress for you. These skills may help you to avoid or prepare for stressful events.  Time management. Set your priorities, keep a calendar of events, and learn to say no. Taking these actions can help you avoid making too many commitments. Techniques for coping with stress include:  Rethinking the problem. Try to think realistically about stressful events rather than ignoring them or overreacting. Try to find the positives in a stressful situation rather than focusing on the negatives.  Exercise. Physical exercise can release both physical and emotional tension. The key is to find a form of exercise that you enjoy and do it regularly.  Relaxation techniques. These relax the body and mind. The key is to find one or more that you enjoy and use the techniques regularly. Examples include: ? Meditation, deep breathing, or progressive relaxation techniques. ? Yoga or tai chi. ? Biofeedback, mindfulness techniques, or journaling. ? Listening to music, being out in nature, or participating in other hobbies.  Practicing a healthy lifestyle. Eat a balanced diet, drink plenty of water, limit or avoid caffeine, and get plenty of sleep.  Having a strong support network. Spend time with family, friends, or other people you enjoy being around. Express your feelings and talk things over with someone you trust. Counseling or talk therapy with a mental health professional may be helpful if you are having trouble managing stress on your own. Follow these instructions at home: Lifestyle   Avoid drugs.  Do not use any products that contain nicotine or tobacco, such as cigarettes, e-cigarettes, and chewing tobacco. If you need help quitting, ask your health care provider.  Limit alcohol intake to no more than 1  drink a day for nonpregnant women and 2 drinks a day for men. One drink equals 12 oz of beer, 5 oz of wine, or 1 oz of hard liquor  Do not use alcohol or drugs to relax.  Eat a balanced diet that includes fresh fruits and vegetables, whole grains, lean meats, fish, eggs, and beans, and low-fat dairy. Avoid processed foods and foods high in added fat, sugar, and salt.  Exercise at least 30 minutes on 5 or more days each week.  Get 7-8 hours of sleep each night. General instructions   Practice stress management techniques as discussed with your health care provider.  Drink enough fluid to keep your urine clear or pale yellow.  Take over-the-counter and prescription medicines only as told by your health care provider.  Keep all follow-up visits as told by your health care provider. This is important. Contact a health care provider if:  Your symptoms get worse.  You have new symptoms.  You feel overwhelmed by your problems and can no longer manage them on your own. Get help right away if:  You have thoughts of hurting yourself or others. If you ever feel like you may hurt yourself or others, or have thoughts about taking your own life, get help right away. You can go to your nearest emergency department or call:  Your local emergency services (911 in the [place]  A suicide crisis helpline, such as the [facility] at (780) 860-0194. This is open 24 hours a day. Summary  Stress is a normal reaction to life events. It can cause problems if it happens too often or for too long.  Practicing stress management techniques is the best way to treat stress.  Counseling  or talk therapy with a mental health professional may be helpful if you are having trouble managing stress on your own. This information is not intended to replace advice given to you by your health care provider. Make sure you discuss any questions you have with your health care provider. Document  Revised: 03/09/2019 Document Reviewed: 09/29/2016 Elsevier Patient Education  [employer].    If you have lab work done today you will be contacted with your lab results within the next 2 weeks.  If you have not heard from [place] then please contact us. The fastest way to get your results is to register for My Chart.   IF you received an x-ray today, you will receive an invoice from [facility]. Please contact [facility] at [phone] with questions or concerns regarding your invoice.   IF you received labwork today, you will receive an invoice from [place]. Please contact [facility] at [phone] with questions or concerns regarding your invoice.   Our billing staff will not be able to assist you with questions regarding bills from these companies.  You will be contacted with the lab results as soon as they are available. The fastest way to get your results is to activate your My Chart account. Instructions are located on the last page of this paperwork. If you have not heard from [place] regarding the results in 2 weeks, please contact this office.         Signed, Merri Ray, MD [facility]

## 2020-01-18 ENCOUNTER — Encounter: Payer: BC Managed Care – PPO | Admitting: Family Medicine

## 2020-02-29 ENCOUNTER — Encounter: Payer: BC Managed Care – PPO | Admitting: Family Medicine

## 2020-03-04 ENCOUNTER — Other Ambulatory Visit: Payer: Self-pay | Admitting: Family Medicine

## 2020-03-04 DIAGNOSIS — I160 Hypertensive urgency: Secondary | ICD-10-CM

## 2020-03-04 DIAGNOSIS — I10 Essential (primary) hypertension: Secondary | ICD-10-CM

## 2020-03-04 NOTE — Telephone Encounter (Signed)
Requested Prescriptions  Pending Prescriptions Disp Refills   amLODipine (NORVASC) 2.5 MG tablet [Pharmacy Med Name: AMLODIPINE BESYLATE TABS 2.5MG ] 90 tablet 0    Sig: TAKE 1 TABLET DAILY     Cardiovascular:  Calcium Channel Blockers Failed - 03/04/2020 12:29 AM      Failed - Last BP in normal range    BP Readings from Last 1 Encounters:  09/20/19 (!) 190/98         Passed - Valid encounter within last 6 months    Recent Outpatient Visits          5 months ago Essential hypertension   [facility], Asencion Partridge, MD   7 months ago Hyperlipidemia, unspecified hyperlipidemia type   [facility], Asencion Partridge, MD   1 year ago Annual physical exam   [facility], Asencion Partridge, MD   2 years ago Essential hypertension   [facility], Asencion Partridge, MD   2 years ago Essential hypertension   [facility], Asencion Partridge, MD      Future Appointments            In 1 month Neva Seat Asencion Partridge, MD [facility] [place], [facility]

## 2020-04-03 ENCOUNTER — Ambulatory Visit (INDEPENDENT_AMBULATORY_CARE_PROVIDER_SITE_OTHER): Payer: BC Managed Care – PPO | Admitting: Family Medicine

## 2020-04-03 ENCOUNTER — Encounter: Payer: Self-pay | Admitting: Family Medicine

## 2020-04-03 ENCOUNTER — Other Ambulatory Visit: Payer: Self-pay

## 2020-04-03 VITALS — BP 155/81 | HR 84 | Temp 98.6°F | Ht 70.0 in | Wt 244.0 lb

## 2020-04-03 DIAGNOSIS — I10 Essential (primary) hypertension: Secondary | ICD-10-CM

## 2020-04-03 DIAGNOSIS — Z1159 Encounter for screening for other viral diseases: Secondary | ICD-10-CM | POA: Diagnosis not present

## 2020-04-03 DIAGNOSIS — R7989 Other specified abnormal findings of blood chemistry: Secondary | ICD-10-CM

## 2020-04-03 DIAGNOSIS — Z6835 Body mass index (BMI) 35.0-35.9, adult: Secondary | ICD-10-CM | POA: Diagnosis not present

## 2020-04-03 DIAGNOSIS — Z0000 Encounter for general adult medical examination without abnormal findings: Secondary | ICD-10-CM | POA: Diagnosis not present

## 2020-04-03 DIAGNOSIS — Z131 Encounter for screening for diabetes mellitus: Secondary | ICD-10-CM

## 2020-04-03 MED ORDER — HYDROCHLOROTHIAZIDE 25 MG PO TABS: 25.0000 mg | ORAL_TABLET | Freq: Every day | ORAL | 2 refills | Status: DC

## 2020-04-03 MED ORDER — LOSARTAN POTASSIUM 100 MG PO TABS: 100.0000 mg | ORAL_TABLET | Freq: Every day | ORAL | 2 refills | Status: DC

## 2020-04-03 MED ORDER — AMLODIPINE BESYLATE 5 MG PO TABS: 5.0000 mg | ORAL_TABLET | Freq: Every day | ORAL | 2 refills | Status: DC

## 2020-04-03 NOTE — Patient Instructions (Addendum)
Increase amlodipine to 5mg  per day. Continue other meds same dose for now.  Increase exercise as discussed, I will check liver tests again.  As weight improves, expect those to also improve, but we can potentially check an ultrasound if needed.  Follow-up for lab only visit in 2 weeks. Schedule dentist visit.  History coming in today.  Let me know if there are questions   Keeping you healthy  Get these tests  Blood pressure- Have your blood pressure checked once a year by your healthcare provider.  Normal blood pressure is 120/80.  Weight- Have your body mass index (BMI) calculated to screen for obesity.  BMI is a measure of body fat based on height and weight. You can also calculate your own BMI at .  Cholesterol- Have your cholesterol checked regularly starting at age 58, sooner may be necessary if you have diabetes, high blood pressure, if a family member developed heart diseases at an early age or if you smoke.   Chlamydia, HIV, and other sexual transmitted disease- Get screened each year until the age of 98 then within three months of each new sexual partner.  Diabetes- Have your blood sugar checked regularly if you have high blood pressure, high cholesterol, a family history of diabetes or if you are overweight.  Get these vaccines  Flu shot- Every fall.  Tetanus shot- Every 10 years.  Menactra- Single dose; prevents meningitis.  Take these steps  Don't smoke- If you do smoke, ask your healthcare provider about quitting. For tips on how to quit, go to www.smokefree.gov or call 1-800-QUIT-NOW.  Be physically active- Exercise 5 days a week for at least 30 minutes.  If you are not already physically active start slow and gradually work up to 30 minutes of moderate physical activity.  Examples of moderate activity include walking briskly, mowing the yard, dancing, swimming bicycling, etc.  Eat a healthy diet- Eat a variety of healthy foods such as  fruits, vegetables, low fat milk, low fat cheese, yogurt, lean meats, poultry, fish, beans, tofu, etc.  For more information on healthy eating, go to www.thenutritionsource.org  Drink alcohol in moderation- Limit alcohol intake two drinks or less a day.  Never drink and drive.  Dentist- Brush and floss teeth twice daily; visit your dentis twice a year.  Depression-Your emotional health is as important as your physical health.  If you're feeling down, losing interest in things you normally enjoy please talk with your healthcare provider.  Gun Safety- If you keep a gun in your home, keep it unloaded and with the safety lock on.  Bullets should be stored separately.  Helmet use- Always wear a helmet when riding a motorcycle, bicycle, rollerblading or skateboarding.  Safe sex- If you may be exposed to a sexually transmitted infection, use a condom  Seat belts- Seat bels can save your life; always wear one.  Smoke/Carbon Monoxide detectors- These detectors need to be installed on the appropriate level of your home.  Replace batteries at least once a year.  Skin Cancer- When out in the sun, cover up and use sunscreen SPF 15 or higher.  Violence- If anyone is threatening or hurting you, please tell your healthcare provider.  If you have lab work done today you will be contacted with your lab results within the next 2 weeks.  If you have not heard from 22 then please contact us. The fastest way to get your results is to register for My Chart.   IF you received  an x-ray today, you will receive an invoice from [facility]. Please contact [facility] at [phone] with questions or concerns regarding your invoice.   IF you received labwork today, you will receive an invoice from [place]. Please contact [facility] at [phone] with questions or concerns regarding your invoice.   Our billing staff will not be able to assist you with questions regarding bills from these  companies.  You will be contacted with the lab results as soon as they are available. The fastest way to get your results is to activate your My Chart account. Instructions are located on the last page of this paperwork. If you have not heard from [place] regarding the results in 2 weeks, please contact this office.

## 2020-04-03 NOTE — Progress Notes (Signed)
Subjective:  Patient ID: Cameron Norris, male    DOB: March 15, 1981  Age: 39 y.o. MRN: [id number]  CC:  Chief Complaint  Patient presents with  . Annual Exam    pt reports that as far as his general health he feels good with no complaints. Pt isn't fasting curently.    HPI Cameron Norris presents for   Annual exam.  Elevated LFTs Elevated in January.  6-8 alcohol drinks per week.  No regular tylenol, MVI Qd, no other supplements.  No n/v/abd pain. Weight up over the years.    Lab Results  Component Value Date   ALT 79 (H) 09/17/2019   AST 42 (H) 09/17/2019   ALKPHOS 53 09/17/2019   BILITOT 0.6 09/17/2019    Obesity: Wt Readings from Last 3 Encounters:  04/03/20 244 lb (110.7 kg)  09/20/19 243 lb (110.2 kg)  07/23/19 248 lb (112.5 kg)   Body mass index is 35.01 kg/m. Trying to be better about walking.  More conscious about diet.   Hypertension: Amlodipine 2.5 mg daily, hydrochlorothiazide 25 mg daily, losartan 100 mg daily. He was working on the [employer] at January visit.  Amlodipine was added at that time. Home readings: 145/85 at home.  BP Readings from Last 3 Encounters:  04/03/20 (!) 155/81  09/20/19 (!) 190/98  09/17/19 (!) 161/88   Lab Results  Component Value Date   CREATININE 0.86 09/17/2019   Immunization History  Administered Date(s) Administered  . Influenza-Unspecified 06/04/2018, 06/09/2019  Tdap 09/06/2014. Covid vaccine: s/p Pfizer - March and April  Depression screen [facility] 2/9 04/03/2020 09/20/2019 07/23/2019 08/04/2018 12/15/2017  Decreased Interest 0 0 0 0 0  Down, Depressed, Hopeless 0 0 0 0 0  PHQ - 2 Score 0 0 0 0 0   Dental: has dentist - none since Covid.  Exercise: Has been focused on diet.  10k steps 3-4 days per week. Plans to increase      History Patient Active Problem List   Diagnosis Date Noted  . Closed nondisplaced fracture of neck of left radius 10/14/2017  . Closed nondisplaced fracture of head of radius with routine  healing 10/14/2017   Past Medical History:  Diagnosis Date  . COVID-19   . Hypertension    Past Surgical History:  Procedure Laterality Date  . WISDOM TOOTH EXTRACTION     Allergies  Allergen Reactions  . Ace Inhibitors Cough   Prior to Admission medications   Medication Sig Start Date End Date Taking? Authorizing Provider  amLODipine (NORVASC) 2.5 MG tablet TAKE 1 TABLET DAILY 03/04/20  Yes [name], MD  hydrochlorothiazide (HYDRODIURIL) 25 MG tablet Take 1 tablet (25 mg total) by mouth daily. 07/23/19  Yes [name], MD  losartan (COZAAR) 100 MG tablet Take 1 tablet (100 mg total) by mouth daily. 07/23/19  Yes [name], MD   Social History   Socioeconomic History  . Marital status: Married    Spouse name: Not on file  . Number of children: Not on file  . Years of education: Not on file  . Highest education level: Not on file  Occupational History  . Not on file  Tobacco Use  . Smoking status: Never Smoker  . Smokeless tobacco: Never Used  Vaping Use  . Vaping Use: Never used  Substance and Sexual Activity  . Alcohol use: Yes    Comment: occ  . Drug use: No  . Sexual activity: Yes  Other Topics Concern  . Not  on file  Social History Narrative  . Not on file   Social Determinants of Health   Financial Resource Strain:   . Difficulty of Paying Living Expenses:   Food Insecurity:   . Worried About Programme researcher, broadcasting/film/video in the Last Year:   . Barista in the Last Year:   Transportation Needs:   . Freight forwarder (Medical):   Marland Kitchen Lack of Transportation (Non-Medical):   Physical Activity:   . Days of Exercise per Week:   . Minutes of Exercise per Session:   Stress:   . Feeling of Stress :   Social Connections:   . Frequency of Communication with Friends and Family:   . Frequency of Social Gatherings with Friends and Family:   . Attends Religious Services:   . Active Member of Clubs or Organizations:   . Attends Tax inspector Meetings:   Marland Kitchen Marital Status:   Intimate Partner Violence:   . Fear of Current or Ex-Partner:   . Emotionally Abused:   Marland Kitchen Physically Abused:   . Sexually Abused:     Review of Systems 13 point review of systems per patient health survey noted.  Negative other than as indicated above or in HPI.    Objective:   Vitals:   04/03/20 1423  BP: (!) 155/81  Pulse: 84  Temp: 98.6 F (37 C)  TempSrc: Temporal  SpO2: 95%  Weight: 244 lb (110.7 kg)  Height: 5\' 10"  (1.778 m)     Physical Exam Vitals reviewed.  Constitutional:      Appearance: He is well-developed.  HENT:     Head: Normocephalic and atraumatic.     Right Ear: External ear normal.     Left Ear: External ear normal.  Eyes:     Conjunctiva/sclera: Conjunctivae normal.     Pupils: Pupils are equal, round, and reactive to light.  Neck:     Thyroid: No thyromegaly.  Cardiovascular:     Rate and Rhythm: Normal rate and regular rhythm.     Heart sounds: Normal heart sounds.  Pulmonary:     Effort: Pulmonary effort is normal. No respiratory distress.     Breath sounds: Normal breath sounds. No wheezing.  Abdominal:     General: There is no distension.     Palpations: Abdomen is soft.     Tenderness: There is no abdominal tenderness.  Musculoskeletal:        General: No tenderness. Normal range of motion.     Cervical back: Normal range of motion and neck supple.  Lymphadenopathy:     Cervical: No cervical adenopathy.  Skin:    General: Skin is warm and dry.  Neurological:     Mental Status: He is alert and oriented to person, place, and time.     Deep Tendon Reflexes: Reflexes are normal and symmetric.  Psychiatric:        Behavior: Behavior normal.     Assessment & Plan:  Cameron Norris is a 39 y.o. male . Annual physical exam  - -anticipatory guidance as below in AVS, screening labs above. Health maintenance items as above in HPI discussed/recommended as applicable.   Need for hepatitis C  screening test - Plan: Hepatitis C antibody, CANCELED: Hepatitis C antibody  Essential hypertension - Plan: losartan (COZAAR) 100 MG tablet, hydrochlorothiazide (HYDRODIURIL) 25 MG tablet, amLODipine (NORVASC) 5 MG tablet, Comprehensive metabolic panel, Lipid panel, CANCELED: Comprehensive metabolic panel, CANCELED: Lipid panel  - decreased control - increase  norvasc to 5mg  qd.   Screening for diabetes mellitus (DM) - Plan: Hemoglobin A1c, CANCELED: Hemoglobin A1c  Elevated LFTs BMI 35.0-35.9,adult  -Commended on change in diet, plan for increase activity/exercise with goal BMI closer to low thirties.  Lab only visit planned in the next few weeks.  2-month follow-up  No orders of the defined types were placed in this encounter.  Patient Instructions       If you have lab work done today you will be contacted with your lab results within the next 2 weeks.  If you have not heard from 2-month then please contact us. The fastest way to get your results is to register for My Chart.   IF you received an x-ray today, you will receive an invoice from [facility]. Please contact [facility] at [phone] with questions or concerns regarding your invoice.   IF you received labwork today, you will receive an invoice from [facility]. Please contact [facility] at [phone] with questions or concerns regarding your invoice.   Our billing staff will not be able to assist you with questions regarding bills from these companies.  You will be contacted with the lab results as soon as they are available. The fastest way to get your results is to activate your My Chart account. Instructions are located on the last page of this paperwork. If you have not heard from [phone] regarding the results in 2 weeks, please contact this office.         Signed, [place], MD [facility]

## 2020-04-15 ENCOUNTER — Other Ambulatory Visit: Payer: Self-pay | Admitting: Family Medicine

## 2020-04-15 DIAGNOSIS — I10 Essential (primary) hypertension: Secondary | ICD-10-CM

## 2020-04-15 NOTE — Telephone Encounter (Signed)
Requested Prescriptions  Pending Prescriptions Disp Refills  . hydrochlorothiazide (HYDRODIURIL) 25 MG tablet [Pharmacy Med Name: HYDROCHLOROTHIAZIDE 25 MG TAB] 90 tablet 0    Sig: TAKE 1 TABLET BY MOUTH EVERY DAY     Cardiovascular: Diuretics - Thiazide Failed - 04/15/2020  1:16 AM      Failed - Last BP in normal range    BP Readings from Last 1 Encounters:  04/03/20 (!) 155/81         Passed - Ca in normal range and within 360 days    Calcium  Date Value Ref Range Status  09/17/2019 9.4 8.9 - 10.3 mg/dL Final         Passed - Cr in normal range and within 360 days    Creatinine, Ser  Date Value Ref Range Status  09/17/2019 0.86 0.61 - 1.24 mg/dL Final         Passed - K in normal range and within 360 days    Potassium  Date Value Ref Range Status  09/17/2019 3.6 3.5 - 5.1 mmol/L Final         Passed - Na in normal range and within 360 days    Sodium  Date Value Ref Range Status  09/17/2019 139 135 - 145 mmol/L Final  07/23/2019 140 134 - 144 mmol/L Final         Passed - Valid encounter within last 6 months    Recent Outpatient Visits          1 week ago Annual physical exam   [facility] [place], Asencion Partridge, MD   6 months ago Essential hypertension   [facility] [place], Asencion Partridge, MD   8 months ago Hyperlipidemia, unspecified hyperlipidemia type   [facility] [place], Asencion Partridge, MD   1 year ago Annual physical exam   [facility] [place], Asencion Partridge, MD   2 years ago Essential hypertension   [facility] [place] [facility], Asencion Partridge, MD      Future Appointments            In 2 months [name], MD [facility] [place], [facility]           . losartan (COZAAR) 100 MG tablet [Pharmacy Med Name: LOSARTAN POTASSIUM 100 MG TAB] 90 tablet 0    Sig: TAKE 1 TABLET BY MOUTH EVERY DAY     Cardiovascular:  Angiotensin Receptor Blockers Failed - 04/15/2020  1:16 AM      Failed - Cr in normal range and within 180 days     Creatinine, Ser  Date Value Ref Range Status  09/17/2019 0.86 0.61 - 1.24 mg/dL Final         Failed - K in normal range and within 180 days    Potassium  Date Value Ref Range Status  09/17/2019 3.6 3.5 - 5.1 mmol/L Final         Failed - Last BP in normal range    BP Readings from Last 1 Encounters:  04/03/20 (!) 155/81         Passed - Patient is not pregnant      Passed - Valid encounter within last 6 months    Recent Outpatient Visits          1 week ago Annual physical exam   [facility] [place], Asencion Partridge, MD   6 months ago Essential hypertension   [facility] [place], [place]  R, MD   8 months ago Hyperlipidemia, unspecified hyperlipidemia type   [facility] [place] [facility], Asencion Partridge, MD   1 year ago Annual physical exam   [facility] [place], Asencion Partridge, MD   2 years ago Essential hypertension   [facility] [place], Asencion Partridge, MD      Future Appointments            In 2 months Neva Seat Asencion Partridge, MD [facility] [place], [facility]           Medications were refilled and sent to [facility] on 04/03/2020 - # 90 with 2 refills for each med.  Refilling to local pharmacy for patient to have medication while awaiting delivery of medications from [facility].

## 2020-04-17 ENCOUNTER — Ambulatory Visit (INDEPENDENT_AMBULATORY_CARE_PROVIDER_SITE_OTHER): Payer: BC Managed Care – PPO | Admitting: Family Medicine

## 2020-04-17 ENCOUNTER — Other Ambulatory Visit: Payer: Self-pay

## 2020-04-17 DIAGNOSIS — Z1159 Encounter for screening for other viral diseases: Secondary | ICD-10-CM

## 2020-04-17 DIAGNOSIS — Z131 Encounter for screening for diabetes mellitus: Secondary | ICD-10-CM | POA: Diagnosis not present

## 2020-04-17 DIAGNOSIS — I10 Essential (primary) hypertension: Secondary | ICD-10-CM

## 2020-04-18 LAB — COMPREHENSIVE METABOLIC PANEL
ALT: 103 IU/L — ABNORMAL HIGH (ref 0–44)
AST: 55 IU/L — ABNORMAL HIGH (ref 0–40)
Albumin/Globulin Ratio: 2.2 (ref 1.2–2.2)
Albumin: 5.1 g/dL — ABNORMAL HIGH (ref 4.0–5.0)
Alkaline Phosphatase: 54 IU/L (ref 48–121)
BUN/Creatinine Ratio: 15 (ref 9–20)
BUN: 14 mg/dL (ref 6–20)
Bilirubin Total: 0.6 mg/dL (ref 0.0–1.2)
CO2: 24 mmol/L (ref 20–29)
Calcium: 10 mg/dL (ref 8.7–10.2)
Chloride: 99 mmol/L (ref 96–106)
Creatinine, Ser: 0.96 mg/dL (ref 0.76–1.27)
GFR calc Af Amer: 116 mL/min/{1.73_m2} (ref >59)
GFR calc non Af Amer: 101 mL/min/{1.73_m2} (ref >59)
Globulin, Total: 2.3 g/dL (ref 1.5–4.5)
Glucose: 85 mg/dL (ref 65–99)
Potassium: 5 mmol/L (ref 3.5–5.2)
Sodium: 139 mmol/L (ref 134–144)
Total Protein: 7.4 g/dL (ref 6.0–8.5)

## 2020-04-18 LAB — LIPID PANEL
Chol/HDL Ratio: 4.4 ratio (ref 0.0–5.0)
Cholesterol, Total: 239 mg/dL — ABNORMAL HIGH (ref 100–199)
HDL: 54 mg/dL (ref >39)
LDL Chol Calc (NIH): 166 mg/dL — ABNORMAL HIGH (ref 0–99)
Triglycerides: 107 mg/dL (ref 0–149)
VLDL Cholesterol Cal: 19 mg/dL (ref 5–40)

## 2020-04-18 LAB — HEMOGLOBIN A1C
Est. average glucose Bld gHb Est-mCnc: 114 mg/dL
Hgb A1c MFr Bld: 5.6 % (ref 4.8–5.6)

## 2020-04-18 LAB — HEPATITIS C ANTIBODY: Hep C Virus Ab: <0.1 s/co ratio (ref 0.0–0.9)

## 2020-05-17 ENCOUNTER — Encounter: Payer: Self-pay | Admitting: Family Medicine

## 2020-06-03 ENCOUNTER — Encounter: Payer: Self-pay | Admitting: Family Medicine

## 2020-06-17 ENCOUNTER — Encounter: Payer: Self-pay | Admitting: Family Medicine

## 2020-06-17 NOTE — Telephone Encounter (Signed)
FYI  Pt would like to revisit Liver US at next OV though his recent labs show improved levels

## 2020-07-04 ENCOUNTER — Ambulatory Visit: Payer: BC Managed Care – PPO | Admitting: Family Medicine

## 2020-10-01 ENCOUNTER — Other Ambulatory Visit: Payer: Self-pay | Admitting: Family Medicine

## 2020-10-01 DIAGNOSIS — I10 Essential (primary) hypertension: Secondary | ICD-10-CM

## 2020-10-01 NOTE — Telephone Encounter (Signed)
Requested medication (s) are due for refill today:   Yes for both  Requested medication (s) are on the active medication list:   Yes for both  Future visit scheduled:   No   Last ordered: HCTZ nnd losartan 04/15/2020 #90 with 1 refill.  Clinic note:  Returned because the protocol was failed due to invalid encounter within 6 months.  [facility] does not schedule for this practice.   Requested Prescriptions  Pending Prescriptions Disp Refills   losartan (COZAAR) 100 MG tablet [Pharmacy Med Name: LOSARTAN POTASSIUM 100 MG TAB] 90 tablet 0    Sig: TAKE 1 TABLET BY MOUTH EVERY DAY      Cardiovascular:  Angiotensin Receptor Blockers Failed - 10/01/2020 12:34 PM      Failed - Last BP in normal range    BP Readings from Last 1 Encounters:  04/03/20 (!) 155/81          Failed - Valid encounter within last 6 months    Recent Outpatient Visits           5 months ago Essential hypertension   [facility] [place], Asencion Partridge, MD   6 months ago Annual physical exam   [facility] [place], Asencion Partridge, MD   1 year ago Essential hypertension   [facility] [place], Asencion Partridge, MD   1 year ago Hyperlipidemia, unspecified hyperlipidemia type   [facility] [place], Asencion Partridge, MD   2 years ago Annual physical exam   [facility] [place] [facility], Asencion Partridge, MD                Passed - Cr in normal range and within 180 days    Creatinine, Ser  Date Value Ref Range Status  04/17/2020 0.96 0.76 - 1.27 mg/dL Final          Passed - K in normal range and within 180 days    Potassium  Date Value Ref Range Status  04/17/2020 5.0 3.5 - 5.2 mmol/L Final          Passed - Patient is not pregnant        hydrochlorothiazide (HYDRODIURIL) 25 MG tablet [Pharmacy Med Name: HYDROCHLOROTHIAZIDE 25 MG TAB] 90 tablet 0    Sig: TAKE 1 TABLET BY MOUTH EVERY DAY      Cardiovascular: Diuretics - Thiazide Failed - 10/01/2020 12:34 PM      Failed - Last BP in normal range     BP Readings from Last 1 Encounters:  04/03/20 (!) 155/81          Failed - Valid encounter within last 6 months    Recent Outpatient Visits           5 months ago Essential hypertension   [facility] [place] [facility], Asencion Partridge, MD   6 months ago Annual physical exam   [facility] [place], Asencion Partridge, MD   1 year ago Essential hypertension   [facility] [place], Asencion Partridge, MD   1 year ago Hyperlipidemia, unspecified hyperlipidemia type   [facility] [place], Asencion Partridge, MD   2 years ago Annual physical exam   [facility] [place], Asencion Partridge, MD                Passed - Ca in normal range and within 360 days    Calcium  Date Value Ref Range Status  04/17/2020 10.0 8.7 - 10.2  mg/dL Final          Passed - Cr in normal range and within 360 days    Creatinine, Ser  Date Value Ref Range Status  04/17/2020 0.96 0.76 - 1.27 mg/dL Final          Passed - K in normal range and within 360 days    Potassium  Date Value Ref Range Status  04/17/2020 5.0 3.5 - 5.2 mmol/L Final          Passed - Na in normal range and within 360 days    Sodium  Date Value Ref Range Status  04/17/2020 139 134 - 144 mmol/L Final

## 2021-01-03 ENCOUNTER — Other Ambulatory Visit: Payer: Self-pay | Admitting: Family Medicine

## 2021-01-03 DIAGNOSIS — I10 Essential (primary) hypertension: Secondary | ICD-10-CM

## 2021-02-02 ENCOUNTER — Other Ambulatory Visit: Payer: Self-pay | Admitting: Family Medicine

## 2021-02-02 DIAGNOSIS — I10 Essential (primary) hypertension: Secondary | ICD-10-CM

## 2021-02-17 ENCOUNTER — Encounter: Payer: Self-pay | Admitting: Family Medicine

## 2021-02-17 ENCOUNTER — Other Ambulatory Visit: Payer: Self-pay | Admitting: Family Medicine

## 2021-02-17 DIAGNOSIS — I10 Essential (primary) hypertension: Secondary | ICD-10-CM

## 2021-02-17 MED ORDER — LOSARTAN POTASSIUM 100 MG PO TABS: 100.0000 mg | ORAL_TABLET | Freq: Every day | ORAL | 0 refills | Status: DC

## 2021-02-17 MED ORDER — HYDROCHLOROTHIAZIDE 25 MG PO TABS: 25.0000 mg | ORAL_TABLET | Freq: Every day | ORAL | 0 refills | Status: DC

## 2021-02-18 MED ORDER — AMLODIPINE BESYLATE 5 MG PO TABS: 5.0000 mg | ORAL_TABLET | Freq: Every day | ORAL | 0 refills | Status: DC

## 2021-03-05 ENCOUNTER — Other Ambulatory Visit: Payer: Self-pay

## 2021-03-05 ENCOUNTER — Ambulatory Visit: Payer: BC Managed Care – PPO | Admitting: Family Medicine

## 2021-03-05 VITALS — BP 134/74 | HR 86 | Temp 98.2°F | Resp 16 | Ht 70.0 in | Wt 249.0 lb

## 2021-03-05 DIAGNOSIS — I10 Essential (primary) hypertension: Secondary | ICD-10-CM

## 2021-03-05 DIAGNOSIS — R7989 Other specified abnormal findings of blood chemistry: Secondary | ICD-10-CM

## 2021-03-05 DIAGNOSIS — R739 Hyperglycemia, unspecified: Secondary | ICD-10-CM

## 2021-03-05 DIAGNOSIS — E785 Hyperlipidemia, unspecified: Secondary | ICD-10-CM

## 2021-03-05 MED ORDER — HYDROCHLOROTHIAZIDE 25 MG PO TABS: 25.0000 mg | ORAL_TABLET | Freq: Every day | ORAL | 2 refills | Status: DC

## 2021-03-05 MED ORDER — AMLODIPINE BESYLATE 5 MG PO TABS: 5.0000 mg | ORAL_TABLET | Freq: Every day | ORAL | 2 refills | Status: DC

## 2021-03-05 MED ORDER — LOSARTAN POTASSIUM 100 MG PO TABS: 100.0000 mg | ORAL_TABLET | Freq: Every day | ORAL | 2 refills | Status: DC

## 2021-03-05 NOTE — Addendum Note (Signed)
Addended by: Laddie Aquas A on: 03/05/2021 01:00 PM   Modules accepted: Orders

## 2021-03-05 NOTE — Progress Notes (Signed)
Subjective:  Patient ID: Cameron Norris, male    DOB: [birth year]  Age: 40 y.o. MRN: [id number]  CC:  Chief Complaint  Patient presents with   Hypertension    Pt denies physical sxs, doing well, no concerns today will be due for refill of medication. Also here to follow up on previous lab work that showed elevated liver test     HPI Cameron Norris presents for   Hypertension: Treated with amlodipine 5 mg, hydrochlorothiazide 25 mg, losartan 100 mg daily. Still taking daily, no new side effects.  No known cardiac disease or renal disease.  Last labs in August 2021.  Previously discussed Dash diet.  Amlodipine dosage was increased from 2.5 to 5 mg in August 2021. No added salt to food.  On road during spring, less diet adherence. Working on improvements recently. Able to control diet better. Now.  Home readings:low 130/70-80.  BP Readings from Last 3 Encounters:  03/05/21 134/74  04/03/20 (!) 155/81  09/20/19 (!) 190/98   Lab Results  Component Value Date   CREATININE 0.96 04/17/2020   Elevated LFTs Last discussed August 9357.  6-8 alcoholic drinks per week, no regular Tylenol.  Had been working on watching diet as his weight had increased over the years. Hep C antibody negative in August 2021.  LFTs had increased at that time and recommended other testing and ultrasound.  He had improved numbers on outside labs on 06/12/20. ALT 66, AST 39.  Bili 0.57, alk phos 54.  Glucose 102.  Total cholesterol 227, HDL 56, LDL 142, trig 140.  Hep C and hep B antibodies nonreactive.  HIV nonreactive. Alcohol - 8-10 per week.  No tylenol.  Exercise - 3-4 days per week on treadmill.   Had covid vaccine and booster.   Hyperlipidemia: No current meds - outside labs above.  Lab Results  Component Value Date   CHOL 239 (H) 04/17/2020   HDL 54 04/17/2020   LDLCALC 166 (H) 04/17/2020   TRIG 107 04/17/2020   CHOLHDL 4.4 04/17/2020   Lab Results  Component Value Date   ALT 103 (H) 04/17/2020    AST 55 (H) 04/17/2020   ALKPHOS 54 04/17/2020   BILITOT 0.6 04/17/2020   Body mass index is 35.73 kg/m. Wt Readings from Last 3 Encounters:  03/05/21 249 lb (112.9 kg)  04/03/20 244 lb (110.7 kg)  09/20/19 243 lb (110.2 kg)     History Patient Active Problem List   Diagnosis Date Noted   Closed nondisplaced fracture of neck of left radius 10/14/2017   Closed nondisplaced fracture of head of radius with routine healing 10/14/2017   Past Medical History:  Diagnosis Date   COVID-19    Hypertension    Past Surgical History:  Procedure Laterality Date   WISDOM TOOTH EXTRACTION     Allergies  Allergen Reactions   Ace Inhibitors Cough   Prior to Admission medications   Medication Sig Start Date End Date Taking? Authorizing Provider  amLODipine (NORVASC) 5 MG tablet Take 1 tablet (5 mg total) by mouth daily. 02/18/21  Yes Wendie Agreste, MD  hydrochlorothiazide (HYDRODIURIL) 25 MG tablet Take 1 tablet (25 mg total) by mouth daily. 02/17/21  Yes Wendie Agreste, MD  losartan (COZAAR) 100 MG tablet Take 1 tablet (100 mg total) by mouth daily. 02/17/21  Yes Wendie Agreste, MD   Social History   Socioeconomic History   Marital status: Married    Spouse name: Not on file   Number  of children: Not on file   Years of education: Not on file   Highest education level: Not on file  Occupational History   Not on file  Tobacco Use   Smoking status: Never   Smokeless tobacco: Never  Vaping Use   Vaping Use: Never used  Substance and Sexual Activity   Alcohol use: Yes    Comment: occ   Drug use: No   Sexual activity: Yes  Other Topics Concern   Not on file  Social History Narrative   Not on file   Social Determinants of Health   Financial Resource Strain: Not on file  Food Insecurity: Not on file  Transportation Needs: Not on file  Physical Activity: Not on file  Stress: Not on file  Social Connections: Not on file  Intimate Partner Violence: Not on file     Review of Systems  Constitutional:  Negative for fatigue and unexpected weight change.  Eyes:  Negative for visual disturbance.  Respiratory:  Negative for cough, chest tightness and shortness of breath.   Cardiovascular:  Negative for chest pain, palpitations and leg swelling.  Gastrointestinal:  Negative for abdominal pain, blood in stool, nausea and vomiting.  Genitourinary:  Negative for difficulty urinating and hematuria.  Skin:  Negative for color change (no yellowing of skin/eyes.).  Neurological:  Negative for dizziness, light-headedness and headaches.    Objective:   Vitals:   03/05/21 1049  BP: 134/74  Pulse: 86  Resp: 16  Temp: 98.2 F (36.8 C)  TempSrc: Temporal  SpO2: 95%  Weight: 249 lb (112.9 kg)  Height: 5' 10" (1.778 m)     Physical Exam Vitals reviewed.  Constitutional:      Appearance: He is well-developed.  HENT:     Head: Normocephalic and atraumatic.  Neck:     Vascular: No carotid bruit or JVD.  Cardiovascular:     Rate and Rhythm: Normal rate and regular rhythm.     Heart sounds: Normal heart sounds. No murmur heard. Pulmonary:     Effort: Pulmonary effort is normal.     Breath sounds: Normal breath sounds. No rales.  Abdominal:     General: There is no distension.     Palpations: There is no mass.     Tenderness: There is no abdominal tenderness.     Hernia: No hernia is present.  Musculoskeletal:     Right lower leg: No edema.     Left lower leg: No edema.  Skin:    General: Skin is warm and dry.  Neurological:     Mental Status: He is alert and oriented to person, place, and time.  Psychiatric:        Mood and Affect: Mood normal.    Assessment & Plan:  Cameron Norris is a 40 y.o. male . Essential hypertension - Plan: amLODipine (NORVASC) 5 MG tablet, hydrochlorothiazide (HYDRODIURIL) 25 MG tablet, losartan (COZAAR) 100 MG tablet, Comprehensive metabolic panel  -Borderline but stable, watch diet, handout on high sodium foods  given.  Check labs.  Recheck 6 months.  Hyperlipidemia, unspecified hyperlipidemia type - Plan: Lipid panel  -No current statin, check labs.  Some decreased diet adherence up until recently, has improved as well as exercise as above.  Check baseline lipids and decide if statin indicated versus recheck in 3 to 6 months.  Elevated LFTs - Plan: Comprehensive metabolic panel  -Improved on outside labs, repeat LFTs.  Consider ultrasound and potentially may need to stop decreased alcohol intake.  Asymptomatic.  Hyperglycemia - Plan: Comprehensive metabolic panel, Hemoglobin A1c  -Mild on outside labs.  Check A1c.  Asymptomatic.  Meds ordered this encounter  Medications   amLODipine (NORVASC) 5 MG tablet    Sig: Take 1 tablet (5 mg total) by mouth daily.    Dispense:  90 tablet    Refill:  2   hydrochlorothiazide (HYDRODIURIL) 25 MG tablet    Sig: Take 1 tablet (25 mg total) by mouth daily.    Dispense:  90 tablet    Refill:  2   losartan (COZAAR) 100 MG tablet    Sig: Take 1 tablet (100 mg total) by mouth daily.    Dispense:  90 tablet    Refill:  2   Patient Instructions  Try to avoid fast food, and high sodium food - see handout, no change in meds today.  Depending on liver tests may need ultrasound and cutting back on alcohol.   Return to the clinic or go to the nearest emergency room if any of your symptoms worsen or new symptoms occur.     Signed,   Merri Ray, MD [place] 03/05/21 11:34 AM

## 2021-03-05 NOTE — Patient Instructions (Addendum)
Try to avoid fast food, and high sodium food - see handout, no change in meds today.  Depending on liver tests may need ultrasound and cutting back on alcohol.   Return to the clinic or go to the nearest emergency room if any of your symptoms worsen or new symptoms occur.

## 2021-03-25 ENCOUNTER — Other Ambulatory Visit (INDEPENDENT_AMBULATORY_CARE_PROVIDER_SITE_OTHER): Payer: BC Managed Care – PPO

## 2021-03-25 DIAGNOSIS — I10 Essential (primary) hypertension: Secondary | ICD-10-CM

## 2021-03-25 DIAGNOSIS — R739 Hyperglycemia, unspecified: Secondary | ICD-10-CM | POA: Diagnosis not present

## 2021-03-25 DIAGNOSIS — R7989 Other specified abnormal findings of blood chemistry: Secondary | ICD-10-CM | POA: Diagnosis not present

## 2021-03-25 DIAGNOSIS — E785 Hyperlipidemia, unspecified: Secondary | ICD-10-CM

## 2021-03-25 LAB — COMPREHENSIVE METABOLIC PANEL
ALT: 95 U/L — ABNORMAL HIGH (ref 0–53)
AST: 46 U/L — ABNORMAL HIGH (ref 0–37)
Albumin: 4.5 g/dL (ref 3.5–5.2)
Alkaline Phosphatase: 45 U/L (ref 39–117)
BUN: 11 mg/dL (ref 6–23)
CO2: 25 mEq/L (ref 19–32)
Calcium: 9.5 mg/dL (ref 8.4–10.5)
Chloride: 101 mEq/L (ref 96–112)
Creatinine, Ser: 1 mg/dL (ref 0.40–1.50)
GFR: 95.33 mL/min (ref >60.00)
Glucose, Bld: 102 mg/dL — ABNORMAL HIGH (ref 70–99)
Potassium: 3.6 mEq/L (ref 3.5–5.1)
Sodium: 136 mEq/L (ref 135–145)
Total Bilirubin: 0.7 mg/dL (ref 0.2–1.2)
Total Protein: 7.5 g/dL (ref 6.0–8.3)

## 2021-03-25 LAB — LIPID PANEL
Cholesterol: 210 mg/dL — ABNORMAL HIGH (ref 0–200)
HDL: 48.3 mg/dL (ref >39.00)
LDL Cholesterol: 129 mg/dL — ABNORMAL HIGH (ref 0–99)
NonHDL: 161.4
Total CHOL/HDL Ratio: 4
Triglycerides: 160 mg/dL — ABNORMAL HIGH (ref 0.0–149.0)
VLDL: 32 mg/dL (ref 0.0–40.0)

## 2021-03-25 LAB — HEMOGLOBIN A1C: Hgb A1c MFr Bld: 5.6 % (ref 4.6–6.5)

## 2021-09-09 ENCOUNTER — Encounter: Payer: BC Managed Care – PPO | Admitting: Family Medicine

## 2021-09-14 ENCOUNTER — Encounter: Payer: BC Managed Care – PPO | Admitting: Family Medicine

## 2021-12-17 DIAGNOSIS — J029 Acute pharyngitis, unspecified: Secondary | ICD-10-CM | POA: Diagnosis not present

## 2021-12-22 ENCOUNTER — Other Ambulatory Visit: Payer: Self-pay | Admitting: Family Medicine

## 2021-12-22 DIAGNOSIS — I10 Essential (primary) hypertension: Secondary | ICD-10-CM

## 2022-01-25 IMAGING — DX DG CHEST 1V PORT
1 series · 1 of 1 positions shown · non-contrast
Comparison: None.

CLINICAL DATA: Cardiac palpitations and hypertension

EXAM:
PORTABLE CHEST 1 VIEW

[chest ap]
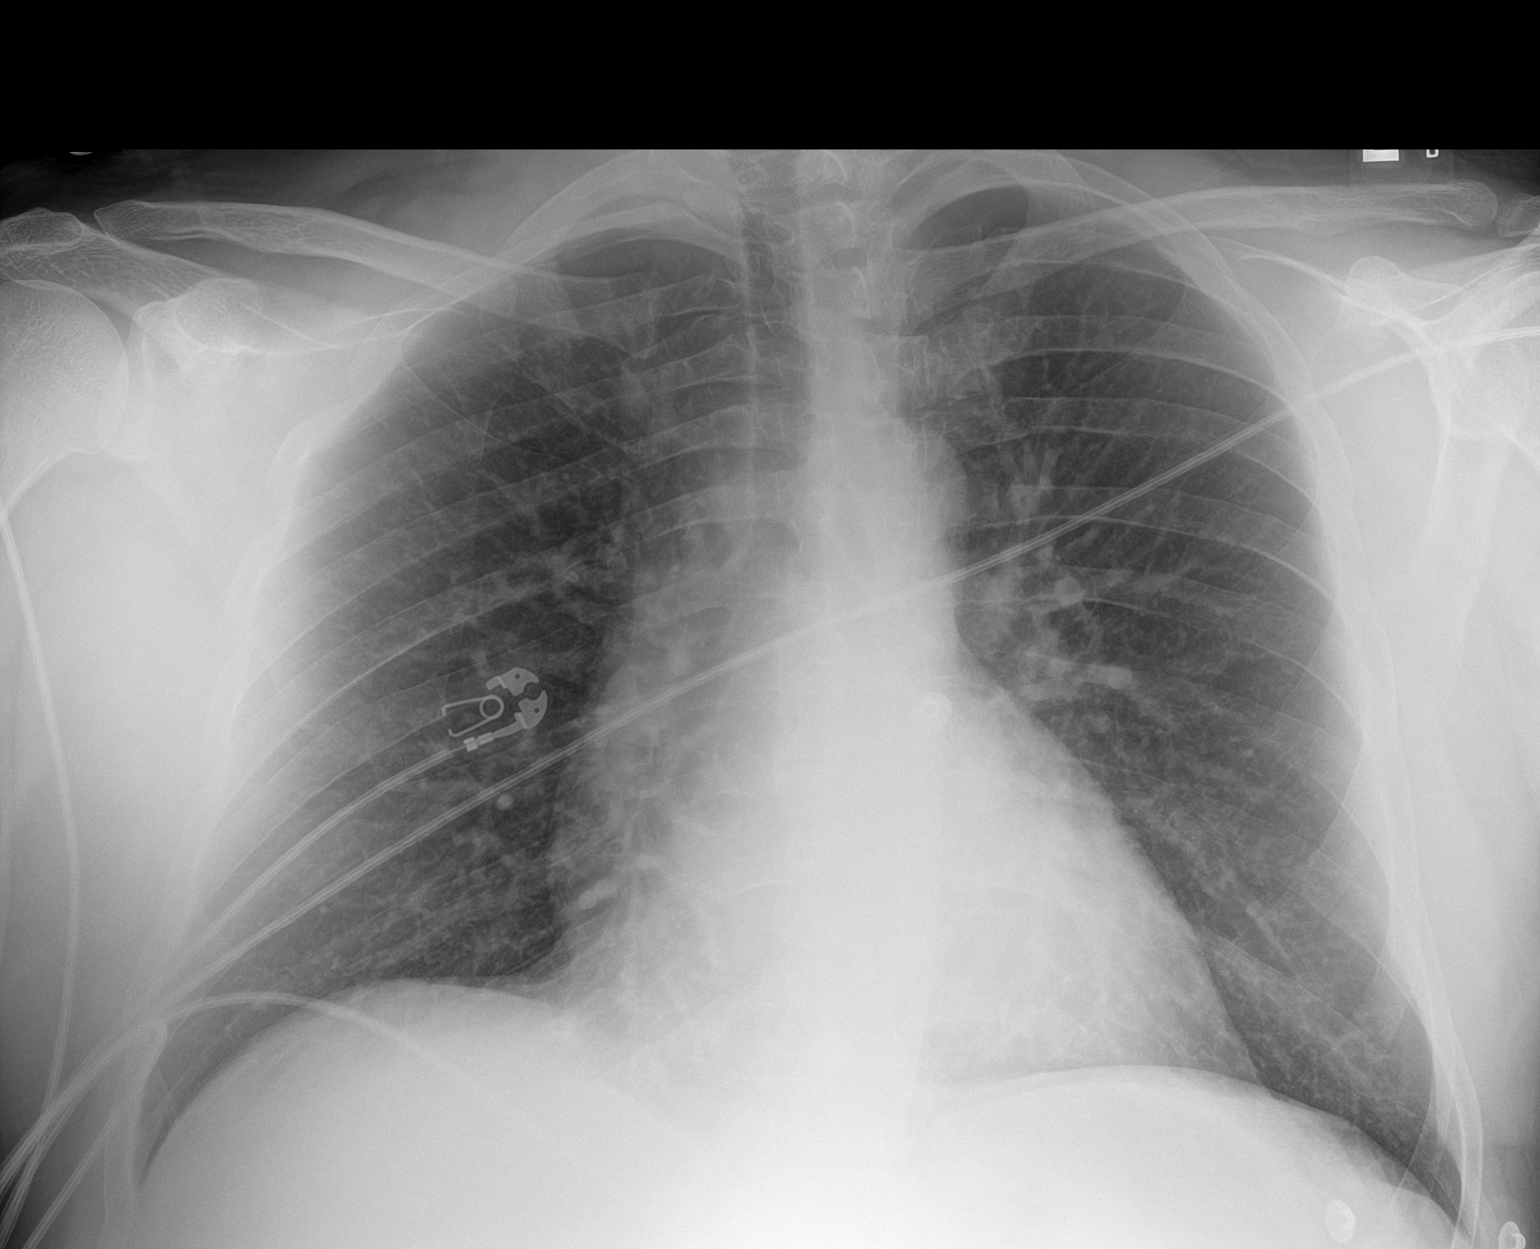

[1 of 1 positions shown; findings below may reference images not displayed]

FINDINGS: Lungs are clear. Heart is borderline enlarged with pulmonary
vascularity normal. No adenopathy. No pneumothorax. No bone lesions.
IMPRESSION: Borderline cardiac enlargement.  Lungs clear.  No adenopathy.

## 2022-03-21 ENCOUNTER — Other Ambulatory Visit: Payer: Self-pay | Admitting: Family Medicine

## 2022-03-21 DIAGNOSIS — I10 Essential (primary) hypertension: Secondary | ICD-10-CM

## 2022-04-09 ENCOUNTER — Encounter: Payer: Self-pay | Admitting: Family Medicine

## 2022-05-05 ENCOUNTER — Ambulatory Visit (INDEPENDENT_AMBULATORY_CARE_PROVIDER_SITE_OTHER): Payer: BC Managed Care – PPO | Admitting: Family Medicine

## 2022-05-05 ENCOUNTER — Encounter: Payer: Self-pay | Admitting: Family Medicine

## 2022-05-05 VITALS — BP 138/70 | HR 77 | Temp 98.4°F | Ht 69.0 in | Wt 250.8 lb

## 2022-05-05 DIAGNOSIS — Z0000 Encounter for general adult medical examination without abnormal findings: Secondary | ICD-10-CM

## 2022-05-05 DIAGNOSIS — R7989 Other specified abnormal findings of blood chemistry: Secondary | ICD-10-CM

## 2022-05-05 DIAGNOSIS — E785 Hyperlipidemia, unspecified: Secondary | ICD-10-CM | POA: Diagnosis not present

## 2022-05-05 DIAGNOSIS — I10 Essential (primary) hypertension: Secondary | ICD-10-CM | POA: Diagnosis not present

## 2022-05-05 DIAGNOSIS — R739 Hyperglycemia, unspecified: Secondary | ICD-10-CM

## 2022-05-05 DIAGNOSIS — Z23 Encounter for immunization: Secondary | ICD-10-CM

## 2022-05-05 LAB — HEMOGLOBIN A1C: Hgb A1c MFr Bld: 5.8 % (ref 4.6–6.5)

## 2022-05-05 MED ORDER — AMLODIPINE BESYLATE 5 MG PO TABS: 5.0000 mg | ORAL_TABLET | Freq: Every day | ORAL | 3 refills | Status: DC

## 2022-05-05 MED ORDER — LOSARTAN POTASSIUM 100 MG PO TABS: 100.0000 mg | ORAL_TABLET | Freq: Every day | ORAL | 3 refills | Status: DC

## 2022-05-05 MED ORDER — HYDROCHLOROTHIAZIDE 25 MG PO TABS: 25.0000 mg | ORAL_TABLET | Freq: Every day | ORAL | 3 refills | Status: DC

## 2022-05-05 NOTE — Patient Instructions (Signed)
Thanks for coming in today.  No medication changes at this time.  Depending on repeat liver tests and ultrasound results I may refer you to a liver specialist.  We will know more once I receive the results.  If any concerns on labs I will let you know.  Take care.  Preventive Care 40-40 Years Old, Male Preventive care refers to lifestyle choices and visits with your health care provider that can promote health and wellness. Preventive care visits are also called wellness exams. What can I expect for my preventive care visit? Counseling During your preventive care visit, your health care provider may ask about your: Medical history, including: Past medical problems. Family medical history. Current health, including: Emotional well-being. Home life and relationship well-being. Sexual activity. Lifestyle, including: Alcohol, nicotine or tobacco, and drug use. Access to firearms. Diet, exercise, and sleep habits. Safety issues such as seatbelt and bike helmet use. Sunscreen use. Work and work Astronomer. Physical exam Your health care provider may check your: Height and weight. These may be used to calculate your BMI (body mass index). BMI is a measurement that tells if you are at a healthy weight. Waist circumference. This measures the distance around your waistline. This measurement also tells if you are at a healthy weight and may help predict your risk of certain diseases, such as type 2 diabetes and high blood pressure. Heart rate and blood pressure. Body temperature. Skin for abnormal spots. What immunizations do I need?  Vaccines are usually given at various ages, according to a schedule. Your health care provider will recommend vaccines for you based on your age, medical history, and lifestyle or other factors, such as travel or where you work. What tests do I need? Screening Your health care provider may recommend screening tests for certain conditions. This may include: Lipid  and cholesterol levels. Diabetes screening. This is done by checking your blood sugar (glucose) after you have not eaten for a while (fasting). Hepatitis B test. Hepatitis C test. HIV (human immunodeficiency virus) test. STI (sexually transmitted infection) testing, if you are at risk. Talk with your health care provider about your test results, treatment options, and if necessary, the need for more tests. Follow these instructions at home: Eating and drinking  Eat a healthy diet that includes fresh fruits and vegetables, whole grains, lean protein, and low-fat dairy products. Drink enough fluid to keep your urine pale yellow. Take vitamin and mineral supplements as recommended by your health care provider. Do not drink alcohol if your health care provider tells you not to drink. If you drink alcohol: Limit how much you have to 0-2 drinks a day. Know how much alcohol is in your drink. In the [place] one drink equals one 12 oz bottle of beer (355 mL), one 5 oz glass of wine (148 mL), or one 1 oz glass of hard liquor (44 mL). Lifestyle Brush your teeth every morning and night with fluoride toothpaste. Floss one time each day. Exercise for at least 30 minutes 5 or more days each week. Do not use any products that contain nicotine or tobacco. These products include cigarettes, chewing tobacco, and vaping devices, such as e-cigarettes. If you need help quitting, ask your health care provider. Do not use drugs. If you are sexually active, practice safe sex. Use a condom or other form of protection to prevent STIs. Find healthy ways to manage stress, such as: Meditation, yoga, or listening to music. Journaling. Talking to a trusted person. Spending time with  friends and family. Minimize exposure to UV radiation to reduce your risk of skin cancer. Safety Always wear your seat belt while driving or riding in a vehicle. Do not drive: If you have been drinking alcohol. Do not ride with someone  who has been drinking. If you have been using any mind-altering substances or drugs. While texting. When you are tired or distracted. Wear a helmet and other protective equipment during sports activities. If you have firearms in your house, make sure you follow all gun safety procedures. Seek help if you have been physically or sexually abused. What's next? Go to your health care provider once a year for an annual wellness visit. Ask your health care provider how often you should have your eyes and teeth checked. Stay up to date on all vaccines. This information is not intended to replace advice given to you by your health care provider. Make sure you discuss any questions you have with your health care provider. Document Revised: 02/04/2021 Document Reviewed: 02/04/2021 Elsevier Patient Education  2023 [employer].

## 2022-05-05 NOTE — Progress Notes (Signed)
Subjective:  Patient ID: Cameron Norris, male    DOB: October 25, 1980  Age: 41 y.o. MRN: [id number]  CC:  Chief Complaint  Patient presents with   Annual Exam    Pt states all is well    HPI Cameron Norris presents for Annual Exam Care team: Pcp:me  No changes in health since last visit. Work is good. 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  Covid infection last month. No antiviral, mild sx's, no residual sx's.   Hypertension: Amlodipine 5 mg daily, hydrochlorothiazide 25 mg daily, losartan 100 mg daily. Home readings: 130-135/75-80 No new med side effects.  BP Readings from Last 3 Encounters:  05/05/22 138/70  03/05/21 134/74  04/03/20 (!) 155/81   Lab Results  Component Value Date   CREATININE 1.00 03/25/2021   Hyperlipidemia: No current meds.  No FH of early heart disease.  Lab Results  Component Value Date   CHOL 210 (H) 03/25/2021   HDL 48.30 03/25/2021   LDLCALC 129 (H) 03/25/2021   TRIG 160.0 (H) 03/25/2021   CHOLHDL 4 03/25/2021   Lab Results  Component Value Date   ALT 95 (H) 03/25/2021   AST 46 (H) 03/25/2021   ALKPHOS 45 03/25/2021   BILITOT 0.7 03/25/2021      Elevated LFTs Noted previously, AST 46, ALT 95 last year, improved from 55, 103 previously. Neg hep C antibody in 03/2020.  Possible hepatic steatosis but no previous ultrasound  Alcohol 8-10 drinks per week at last visit in July 2022. Cut back - now 6 per week.   Obesity: Minimal weight change since last year. Diet: cut back on caffeine, and alcohol. Rare fast food - once per month. No sodas.  Exercise: 3 mile walk/jog 4-5 times per week.   Borderline hyperglycemia with glucose 102 on  03/25/2021.  Normal A1c last year. Wt Readings from Last 3 Encounters:  05/05/22 250 lb 12.8 oz (113.8 kg)  03/05/21 249 lb (112.9 kg)  04/03/20 244 lb (110.7 kg)  Body mass index is 37.04 kg/m. Lab Results  Component Value Date   HGBA1C 5.6 03/25/2021        05/05/2022    1:27 PM 03/05/2021    10:51 AM 04/03/2020    2:25 PM 09/20/2019   10:37 AM 07/23/2019   11:25 AM  Depression screen PHQ 2/9  Decreased Interest 0 0 0 0 0  Down, Depressed, Hopeless 0 0 0 0 0  PHQ - 2 Score 0 0 0 0 0  Altered sleeping 0      Tired, decreased energy 0      Change in appetite 0      Feeling bad or failure about yourself  0      Trouble concentrating 0      Moving slowly or fidgety/restless 0      Suicidal thoughts 0      PHQ-9 Score 0        Health Maintenance  Topic Date Due   INFLUENZA VACCINE  03/23/2022   COVID-19 Vaccine (4 - Pfizer series) 05/21/2022 (Originally 07/29/2020)   TETANUS/TDAP  09/06/2024   Hepatitis C Screening  Completed   HIV Screening  Completed   HPV VACCINES  Aged Out  Maternal GM with colon CA. No prior colon ca screening.  Prostate: does NOT.have family history of prostate cancer  Immunization History  Administered Date(s) Administered   Influenza-Unspecified 06/04/2018, 06/09/2019   PFIZER(Purple Top)SARS-COV-2 Vaccination 11/05/2019, 11/26/2019, 06/03/2020  Flu vaccine today.  Covid booster - within  next 2 months recommend.  No results found. No glasses/contacts. Vision doing well.   Dental: every 6 months.   Alcohol: 6 per week   Tobacco: none  Exercise:as above.    History Patient Active Problem List   Diagnosis Date Noted   Closed nondisplaced fracture of neck of left radius 10/14/2017   Closed nondisplaced fracture of head of radius with routine healing 10/14/2017   Past Medical History:  Diagnosis Date   COVID-19    Hypertension    Past Surgical History:  Procedure Laterality Date   WISDOM TOOTH EXTRACTION     Allergies  Allergen Reactions   Ace Inhibitors Cough   Prior to Admission medications   Medication Sig Start Date End Date Taking? Authorizing Provider  amLODipine (NORVASC) 5 MG tablet TAKE 1 TABLET (5 MG TOTAL) BY MOUTH DAILY. 03/22/22  Yes [name], MD  hydrochlorothiazide (HYDRODIURIL) 25 MG tablet TAKE 1  TABLET (25 MG TOTAL) BY MOUTH DAILY. 03/22/22  Yes [name], MD  losartan (COZAAR) 100 MG tablet TAKE 1 TABLET BY MOUTH EVERY DAY 03/22/22  Yes [name], MD   Social History   Socioeconomic History   Marital status: Married    Spouse name: Not on file   Number of children: Not on file   Years of education: Not on file   Highest education level: Not on file  Occupational History   Not on file  Tobacco Use   Smoking status: Never   Smokeless tobacco: Never  Vaping Use   Vaping Use: Never used  Substance and Sexual Activity   Alcohol use: Yes    Comment: occ   Drug use: No   Sexual activity: Yes  Other Topics Concern   Not on file  Social History Narrative   Not on file   Social Determinants of Health   Financial Resource Strain: Not on file  Food Insecurity: Not on file  Transportation Needs: Not on file  Physical Activity: Not on file  Stress: Not on file  Social Connections: Not on file  Intimate Partner Violence: Not on file    Review of Systems Per HPI. 13 point review of systems per patient health survey noted.  Negative other than as indicated above or in HPI.    Objective:   Vitals:   05/05/22 1328  BP: 138/70  Pulse: 77  Temp: 98.4 F (36.9 C)  SpO2: 97%  Weight: 250 lb 12.8 oz (113.8 kg)  Height: 5\' 9"  (1.753 m)     Physical Exam Vitals reviewed.  Constitutional:      Appearance: He is well-developed.  HENT:     Head: Normocephalic and atraumatic.     Right Ear: External ear normal.     Left Ear: External ear normal.  Eyes:     Conjunctiva/sclera: Conjunctivae normal.     Pupils: Pupils are equal, round, and reactive to light.  Neck:     Thyroid: No thyromegaly.  Cardiovascular:     Rate and Rhythm: Normal rate and regular rhythm.     Heart sounds: Normal heart sounds.  Pulmonary:     Effort: Pulmonary effort is normal. No respiratory distress.     Breath sounds: Normal breath sounds. No wheezing.  Abdominal:      General: There is no distension.     Palpations: Abdomen is soft.     Tenderness: There is no abdominal tenderness.  Musculoskeletal:        General: No tenderness. Normal range  of motion.     Cervical back: Normal range of motion and neck supple.  Lymphadenopathy:     Cervical: No cervical adenopathy.  Skin:    General: Skin is warm and dry.  Neurological:     Mental Status: He is alert and oriented to person, place, and time.     Deep Tendon Reflexes: Reflexes are normal and symmetric.  Psychiatric:        Behavior: Behavior normal.        Assessment & Plan:  Cameron Norris is a 41 y.o. male . Annual physical exam - Plan: Lipid panel, Hemoglobin A1c, Comprehensive metabolic panel  - -anticipatory guidance as below in AVS, screening labs above. Health maintenance items as above in HPI discussed/recommended as applicable.   Need for influenza vaccination - Plan: Flu Vaccine QUAD 6+ mos PF IM (Fluarix Quad PF)  Elevated LFTs - Plan: Lipid panel, Comprehensive metabolic panel, US Abdomen Limited RUQ (LIVER/GB)  -Based on prior readings suspect hepatic steatosis, but will check ultrasound to evaluate further, given possible gastroenterology/hepatology follow-up.  Repeat LFTs.  Hyperlipidemia, unspecified hyperlipidemia type - Plan: Lipid panel  -Mild elevations previously, anticipate improvement with exercise, healthier eating and we will repeat labs today.  Hyperglycemia - Plan: Hemoglobin A1c  -Continue diet/exercise adherence as above.  Essential hypertension  -  Stable, tolerating current regimen. Medications refilled. Labs pending as above.    No orders of the defined types were placed in this encounter.  Patient Instructions  Thanks for coming in today.  No medication changes at this time.  Depending on repeat liver tests and ultrasound results I may refer you to a liver specialist.  We will know more once I receive the results.  If any concerns on labs I will let you  know.  Take care.  Preventive Care 58-12 Years Old, Male Preventive care refers to lifestyle choices and visits with your health care provider that can promote health and wellness. Preventive care visits are also called wellness exams. What can I expect for my preventive care visit? Counseling During your preventive care visit, your health care provider may ask about your: Medical history, including: Past medical problems. Family medical history. Current health, including: Emotional well-being. Home life and relationship well-being. Sexual activity. Lifestyle, including: Alcohol, nicotine or tobacco, and drug use. Access to firearms. Diet, exercise, and sleep habits. Safety issues such as seatbelt and bike helmet use. Sunscreen use. Work and work Astronomer. Physical exam Your health care provider may check your: Height and weight. These may be used to calculate your BMI (body mass index). BMI is a measurement that tells if you are at a healthy weight. Waist circumference. This measures the distance around your waistline. This measurement also tells if you are at a healthy weight and may help predict your risk of certain diseases, such as type 2 diabetes and high blood pressure. Heart rate and blood pressure. Body temperature. Skin for abnormal spots. What immunizations do I need?  Vaccines are usually given at various ages, according to a schedule. Your health care provider will recommend vaccines for you based on your age, medical history, and lifestyle or other factors, such as travel or where you work. What tests do I need? Screening Your health care provider may recommend screening tests for certain conditions. This may include: Lipid and cholesterol levels. Diabetes screening. This is done by checking your blood sugar (glucose) after you have not eaten for a while (fasting). Hepatitis B test. Hepatitis C test. HIV (  human immunodeficiency virus) test. STI (sexually  transmitted infection) testing, if you are at risk. Talk with your health care provider about your test results, treatment options, and if necessary, the need for more tests. Follow these instructions at home: Eating and drinking  Eat a healthy diet that includes fresh fruits and vegetables, whole grains, lean protein, and low-fat dairy products. Drink enough fluid to keep your urine pale yellow. Take vitamin and mineral supplements as recommended by your health care provider. Do not drink alcohol if your health care provider tells you not to drink. If you drink alcohol: Limit how much you have to 0-2 drinks a day. Know how much alcohol is in your drink. In the [place] one drink equals one 12 oz bottle of beer (355 mL), one 5 oz glass of wine (148 mL), or one 1 oz glass of hard liquor (44 mL). Lifestyle Brush your teeth every morning and night with fluoride toothpaste. Floss one time each day. Exercise for at least 30 minutes 5 or more days each week. Do not use any products that contain nicotine or tobacco. These products include cigarettes, chewing tobacco, and vaping devices, such as e-cigarettes. If you need help quitting, ask your health care provider. Do not use drugs. If you are sexually active, practice safe sex. Use a condom or other form of protection to prevent STIs. Find healthy ways to manage stress, such as: Meditation, yoga, or listening to music. Journaling. Talking to a trusted person. Spending time with friends and family. Minimize exposure to UV radiation to reduce your risk of skin cancer. Safety Always wear your seat belt while driving or riding in a vehicle. Do not drive: If you have been drinking alcohol. Do not ride with someone who has been drinking. If you have been using any mind-altering substances or drugs. While texting. When you are tired or distracted. Wear a helmet and other protective equipment during sports activities. If you have firearms in your  house, make sure you follow all gun safety procedures. Seek help if you have been physically or sexually abused. What's next? Go to your health care provider once a year for an annual wellness visit. Ask your health care provider how often you should have your eyes and teeth checked. Stay up to date on all vaccines. This information is not intended to replace advice given to you by your health care provider. Make sure you discuss any questions you have with your health care provider. Document Revised: 02/04/2021 Document Reviewed: 02/04/2021 Elsevier Patient Education  2023 [employer]     Signed,   Meredith Staggers, MD [facility], [facility] 05/05/22 2:24 PM

## 2022-05-06 LAB — COMPREHENSIVE METABOLIC PANEL
ALT: 104 U/L — ABNORMAL HIGH (ref 0–53)
AST: 50 U/L — ABNORMAL HIGH (ref 0–37)
Albumin: 4.7 g/dL (ref 3.5–5.2)
Alkaline Phosphatase: 48 U/L (ref 39–117)
BUN: 14 mg/dL (ref 6–23)
CO2: 27 mEq/L (ref 19–32)
Calcium: 10 mg/dL (ref 8.4–10.5)
Chloride: 101 mEq/L (ref 96–112)
Creatinine, Ser: 0.98 mg/dL (ref 0.40–1.50)
GFR: 96.91 mL/min (ref >60.00)
Glucose, Bld: 88 mg/dL (ref 70–99)
Potassium: 4.1 mEq/L (ref 3.5–5.1)
Sodium: 138 mEq/L (ref 135–145)
Total Bilirubin: 0.7 mg/dL (ref 0.2–1.2)
Total Protein: 8.1 g/dL (ref 6.0–8.3)

## 2022-05-06 LAB — LIPID PANEL
Cholesterol: 222 mg/dL — ABNORMAL HIGH (ref 0–200)
HDL: 55.3 mg/dL (ref >39.00)
LDL Cholesterol: 140 mg/dL — ABNORMAL HIGH (ref 0–99)
NonHDL: 166.33
Total CHOL/HDL Ratio: 4
Triglycerides: 134 mg/dL (ref 0.0–149.0)
VLDL: 26.8 mg/dL (ref 0.0–40.0)

## 2022-06-06 ENCOUNTER — Encounter: Payer: Self-pay | Admitting: Family Medicine

## 2022-06-24 DIAGNOSIS — Z23 Encounter for immunization: Secondary | ICD-10-CM | POA: Diagnosis not present

## 2023-04-07 ENCOUNTER — Encounter (INDEPENDENT_AMBULATORY_CARE_PROVIDER_SITE_OTHER): Payer: Self-pay

## 2023-05-11 ENCOUNTER — Encounter: Payer: BC Managed Care – PPO | Admitting: Family Medicine

## 2023-06-15 ENCOUNTER — Other Ambulatory Visit: Payer: Self-pay | Admitting: Family Medicine

## 2023-06-15 DIAGNOSIS — I10 Essential (primary) hypertension: Secondary | ICD-10-CM

## 2023-07-04 ENCOUNTER — Encounter: Payer: Self-pay | Admitting: Family Medicine

## 2023-07-04 ENCOUNTER — Other Ambulatory Visit: Payer: Self-pay | Admitting: Family Medicine

## 2023-07-04 DIAGNOSIS — I10 Essential (primary) hypertension: Secondary | ICD-10-CM

## 2023-07-05 MED ORDER — HYDROCHLOROTHIAZIDE 25 MG PO TABS: 25.0000 mg | ORAL_TABLET | Freq: Every day | ORAL | 0 refills | Status: DC

## 2023-07-05 MED ORDER — LOSARTAN POTASSIUM 100 MG PO TABS: 100.0000 mg | ORAL_TABLET | Freq: Every day | ORAL | 0 refills | Status: DC

## 2023-07-05 MED ORDER — AMLODIPINE BESYLATE 5 MG PO TABS: 5.0000 mg | ORAL_TABLET | Freq: Every day | ORAL | 0 refills | Status: DC

## 2023-07-05 NOTE — Addendum Note (Signed)
Addended by: Meredith Staggers R on: 07/05/2023 11:16 AM   Modules accepted: Orders

## 2023-08-29 DIAGNOSIS — R0981 Nasal congestion: Secondary | ICD-10-CM | POA: Diagnosis not present

## 2023-08-29 DIAGNOSIS — R051 Acute cough: Secondary | ICD-10-CM | POA: Diagnosis not present

## 2023-08-29 DIAGNOSIS — J029 Acute pharyngitis, unspecified: Secondary | ICD-10-CM | POA: Diagnosis not present

## 2023-09-02 ENCOUNTER — Ambulatory Visit (INDEPENDENT_AMBULATORY_CARE_PROVIDER_SITE_OTHER): Payer: BC Managed Care – PPO | Admitting: Family Medicine

## 2023-09-02 VITALS — BP 126/74 | HR 73 | Temp 98.3°F | Ht 69.0 in | Wt 256.0 lb

## 2023-09-02 DIAGNOSIS — Z3009 Encounter for other general counseling and advice on contraception: Secondary | ICD-10-CM

## 2023-09-02 DIAGNOSIS — I10 Essential (primary) hypertension: Secondary | ICD-10-CM

## 2023-09-02 DIAGNOSIS — Z0000 Encounter for general adult medical examination without abnormal findings: Secondary | ICD-10-CM | POA: Diagnosis not present

## 2023-09-02 DIAGNOSIS — R739 Hyperglycemia, unspecified: Secondary | ICD-10-CM | POA: Diagnosis not present

## 2023-09-02 DIAGNOSIS — F418 Other specified anxiety disorders: Secondary | ICD-10-CM

## 2023-09-02 DIAGNOSIS — Z1329 Encounter for screening for other suspected endocrine disorder: Secondary | ICD-10-CM

## 2023-09-02 DIAGNOSIS — E785 Hyperlipidemia, unspecified: Secondary | ICD-10-CM

## 2023-09-02 DIAGNOSIS — Z23 Encounter for immunization: Secondary | ICD-10-CM | POA: Diagnosis not present

## 2023-09-02 LAB — LIPID PANEL
Cholesterol: 207 mg/dL — ABNORMAL HIGH (ref 0–200)
HDL: 47.1 mg/dL (ref >39.00)
LDL Cholesterol: 134 mg/dL — ABNORMAL HIGH (ref 0–99)
NonHDL: 160.18
Total CHOL/HDL Ratio: 4
Triglycerides: 130 mg/dL (ref 0.0–149.0)
VLDL: 26 mg/dL (ref 0.0–40.0)

## 2023-09-02 LAB — COMPREHENSIVE METABOLIC PANEL
ALT: 109 U/L — ABNORMAL HIGH (ref 0–53)
AST: 41 U/L — ABNORMAL HIGH (ref 0–37)
Albumin: 4.9 g/dL (ref 3.5–5.2)
Alkaline Phosphatase: 45 U/L (ref 39–117)
BUN: 18 mg/dL (ref 6–23)
CO2: 30 meq/L (ref 19–32)
Calcium: 9.9 mg/dL (ref 8.4–10.5)
Chloride: 100 meq/L (ref 96–112)
Creatinine, Ser: 0.99 mg/dL (ref 0.40–1.50)
GFR: 94.85 mL/min (ref >60.00)
Glucose, Bld: 89 mg/dL (ref 70–99)
Potassium: 4 meq/L (ref 3.5–5.1)
Sodium: 139 meq/L (ref 135–145)
Total Bilirubin: 0.6 mg/dL (ref 0.2–1.2)
Total Protein: 8.1 g/dL (ref 6.0–8.3)

## 2023-09-02 LAB — TSH: TSH: 3.6 u[IU]/mL (ref 0.35–5.50)

## 2023-09-02 LAB — CBC WITH DIFFERENTIAL/PLATELET
Basophils Absolute: 0 10*3/uL (ref 0.0–0.1)
Basophils Relative: 0.5 % (ref 0.0–3.0)
Eosinophils Absolute: 0.1 10*3/uL (ref 0.0–0.7)
Eosinophils Relative: 0.7 % (ref 0.0–5.0)
HCT: 48.4 % (ref 39.0–52.0)
Hemoglobin: 16.3 g/dL (ref 13.0–17.0)
Lymphocytes Relative: 36.8 % (ref 12.0–46.0)
Lymphs Abs: 3.2 10*3/uL (ref 0.7–4.0)
MCHC: 33.6 g/dL (ref 30.0–36.0)
MCV: 92 fL (ref 78.0–100.0)
Monocytes Absolute: 1.2 10*3/uL — ABNORMAL HIGH (ref 0.1–1.0)
Monocytes Relative: 13.4 % — ABNORMAL HIGH (ref 3.0–12.0)
Neutro Abs: 4.2 10*3/uL (ref 1.4–7.7)
Neutrophils Relative %: 48.6 % (ref 43.0–77.0)
Platelets: 254 10*3/uL (ref 150.0–400.0)
RBC: 5.26 Mil/uL (ref 4.22–5.81)
RDW: 13.7 % (ref 11.5–15.5)
WBC: 8.6 10*3/uL (ref 4.0–10.5)

## 2023-09-02 LAB — HEMOGLOBIN A1C: Hgb A1c MFr Bld: 5.9 % (ref 4.6–6.5)

## 2023-09-02 MED ORDER — HYDROCHLOROTHIAZIDE 25 MG PO TABS: 25.0000 mg | ORAL_TABLET | Freq: Every day | ORAL | 0 refills | Status: DC

## 2023-09-02 MED ORDER — AMLODIPINE BESYLATE 5 MG PO TABS: 5.0000 mg | ORAL_TABLET | Freq: Every day | ORAL | 0 refills | Status: DC

## 2023-09-02 MED ORDER — LOSARTAN POTASSIUM 100 MG PO TABS: 100.0000 mg | ORAL_TABLET | Freq: Every day | ORAL | 0 refills | Status: DC

## 2023-09-02 MED ORDER — PROPRANOLOL HCL 10 MG PO TABS: 10.0000 mg | ORAL_TABLET | Freq: Every day | ORAL | 3 refills | Status: DC | PRN

## 2023-09-02 NOTE — Progress Notes (Signed)
 Subjective:  Patient ID: Cameron Norris, male    DOB: Dec 30, 1980  Age: 43 y.o. MRN: [id number]  CC:  Chief Complaint  Patient presents with   Annual Exam    Pt would like to discuss getting a referral to get a vasectomy, pt also notes some work stress and anxiety and didn't know if there was something low dose he can take.  Fasting     HPI  Cameron Norris presents for Annual Exam  And other concerns above.  Last visit with me in 2023.  Urgent care earlier this week for cold symptoms. Cough med, feeling better.   Sons are 5 and 8 at [employer].   PCP, me  Hypertension: Amlodipine  5 mg daily, hydrochlorothiazide  25 mg daily, losartan  100 mg daily. No new side effects.  Home readings: 130/85 average.  BP Readings from Last 3 Encounters:  09/02/23 126/74  05/05/22 138/70  03/05/21 134/74   Lab Results  Component Value Date   CREATININE 0.98 05/05/2022   Hyperlipidemia: Last labs in 2023, no meds at that time. No FH of early heart disease.  The 10-year ASCVD risk score (Arnett DK, et al., 2019) is: 1.5%   Values used to calculate the score:     Age: 2 years     Sex: Male     Is Non-Hispanic African American: No     Diabetic: No     Tobacco smoker: No     Systolic Blood Pressure: 126 mmHg     Is BP treated: Yes     HDL Cholesterol: 55.3 mg/dL     Total Cholesterol: 222 mg/dL  Lab Results  Component Value Date   CHOL 222 (H) 05/05/2022   HDL 55.30 05/05/2022   LDLCALC 140 (H) 05/05/2022   TRIG 134.0 05/05/2022   CHOLHDL 4 05/05/2022   Lab Results  Component Value Date   ALT 104 (H) 05/05/2022   AST 50 (H) 05/05/2022   ALKPHOS 48 05/05/2022   BILITOT 0.7 05/05/2022   History of elevated LFTs With obesity.  Possible hepatic steatosis but no previous ultrasound when discussed in 2023.  8-10 drinks per week previously, then had to cut back to 6/week when discussed in 2023 physical.  Negative hepatitis C antibody in 2021. Alcohol - trying to avoid alcohol  during the week, then 5 on Friday and Saturday each - beer or wine.  Family-planning Would like to have a referral for vasectomy.   Situational stress Prior remote work, now in person. Having some anxiety with public speaking, heart races, sweaty. Performance anxiety. No other anxiety and handling other stressors well. Minimal symptoms prior with presentations, better until remote work.  No prior treatment.        09/02/2023    9:36 AM 05/05/2022    1:27 PM 03/05/2021   10:51 AM 04/03/2020    2:25 PM 09/20/2019   10:37 AM  Depression screen PHQ 2/9  Decreased Interest 0 0 0 0 0  Down, Depressed, Hopeless 0 0 0 0 0  PHQ - 2 Score 0 0 0 0 0  Altered sleeping 0 0     Tired, decreased energy 0 0     Change in appetite 0 0     Feeling bad or failure about yourself  0 0     Trouble concentrating 0 0     Moving slowly or fidgety/restless 0 0     Suicidal thoughts 0 0     PHQ-9 Score 0 0  Health Maintenance  Topic Date Due   DTaP/Tdap/Td (1 - Tdap) Never done   COVID-19 Vaccine (5 - 2024-25 season) 04/24/2023   INFLUENZA VACCINE  Completed   Hepatitis C Screening  Completed   HIV Screening  Completed   HPV VACCINES  Aged Out  Maternal GM with colon CA.  Prostate: does not have family history of prostate cancer  Immunization History  Administered Date(s) Administered   Influenza,inj,Quad PF,6+ Mos 05/05/2022   Influenza-Unspecified 06/04/2018, 06/09/2019, 07/13/2023   PFIZER(Purple Top)SARS-COV-2 Vaccination 11/05/2019, 11/26/2019, 06/03/2020   Pfizer(Comirnaty)Fall Seasonal Vaccine 12 years and older 06/24/2022  Flu vaccine in 06/2023, plans on covid booster at pharmacy.  Tdap today  No results found.  No corrective lenses. Normal vision.   Dental: every 6 months.   Alcohol: as above.  Tobacco: none  Exercise: recently joined gym. Plan for increased exercise - 4-5 days per week.    History Patient Active Problem List   Diagnosis Date Noted   Closed  nondisplaced fracture of neck of left radius 10/14/2017   Closed nondisplaced fracture of head of radius with routine healing 10/14/2017   Past Medical History:  Diagnosis Date   COVID-19    Hypertension    Past Surgical History:  Procedure Laterality Date   WISDOM TOOTH EXTRACTION     Allergies  Allergen Reactions   Ace Inhibitors Cough   Prior to Admission medications   Medication Sig Start Date End Date Taking? Authorizing Provider  amLODipine  (NORVASC ) 5 MG tablet Take 1 tablet (5 mg total) by mouth daily. 07/05/23  Yes Levora Reyes SAUNDERS, MD  hydrochlorothiazide  (HYDRODIURIL ) 25 MG tablet Take 1 tablet (25 mg total) by mouth daily. 07/05/23  Yes Levora Reyes SAUNDERS, MD  losartan  (COZAAR ) 100 MG tablet Take 1 tablet (100 mg total) by mouth daily. 07/05/23  Yes Levora Reyes SAUNDERS, MD  Albuterol Sulfate (PROAIR RESPICLICK) 108 [phone] Base) MCG/ACT AEPB  08/29/23   [provider]  AZELASTINE HCL NA  08/29/23   [provider]  predniSONE (DELTASONE) 20 MG tablet  08/29/23   [provider]  promethazine-dextromethorphan (PROMETHAZINE-DM) 6.25-15 MG/5ML syrup  08/29/23   [provider]   Social History   Socioeconomic History   Marital status: Married    Spouse name: Not on file   Number of children: Not on file   Years of education: Not on file   Highest education level: Bachelor's degree (e.g., BA, AB, BS)  Occupational History   Not on file  Tobacco Use   Smoking status: Never   Smokeless tobacco: Never  Vaping Use   Vaping status: Never Used  Substance and Sexual Activity   Alcohol use: Yes    Comment: occ   Drug use: No   Sexual activity: Yes  Other Topics Concern   Not on file  Social History Narrative   Not on file   Social Drivers of Health   Financial Resource Strain: Low Risk  (08/30/2023)   Overall Financial Resource Strain (CARDIA)    Difficulty of Paying Living Expenses: Not hard at all  Food Insecurity: No Food Insecurity  (08/30/2023)   Hunger Vital Sign    Worried About Running Out of Food in the Last Year: Never true    Ran Out of Food in the Last Year: Never true  Transportation Needs: No Transportation Needs (08/30/2023)   PRAPARE - Administrator, Civil Service (Medical): No    Lack of Transportation (Non-Medical): No  Physical Activity: Insufficiently  Active (08/30/2023)   Exercise Vital Sign    Days of Exercise per Week: 3 days    Minutes of Exercise per Session: 20 min  Stress: Stress Concern Present (08/30/2023)   [employer] of Occupational Health - Occupational Stress Questionnaire    Feeling of Stress : To some extent  Social Connections: Moderately Isolated (08/30/2023)   Social Connection and Isolation Panel [NHANES]    Frequency of Communication with Friends and Family: Twice a week    Frequency of Social Gatherings with Friends and Family: Once a week    Attends Religious Services: Never    Database Administrator or Organizations: No    Attends Engineer, Structural: Not on file    Marital Status: Married  Catering Manager Violence: Not on file    Review of Systems 13 point review of systems per patient health survey noted.  Negative other than as indicated above or in HPI.    Objective:   Vitals:   09/02/23 0936  BP: 126/74  Pulse: 73  Temp: 98.3 F (36.8 C)  TempSrc: Temporal  SpO2: 98%  Weight: 256 lb (116.1 kg)  Height: 5' 9 (1.753 m)     Physical Exam Vitals reviewed.  Constitutional:      Appearance: He is well-developed.  HENT:     Head: Normocephalic and atraumatic.     Right Ear: External ear normal.     Left Ear: External ear normal.  Eyes:     Conjunctiva/sclera: Conjunctivae normal.     Pupils: Pupils are equal, round, and reactive to light.  Neck:     Thyroid : No thyromegaly.  Cardiovascular:     Rate and Rhythm: Normal rate and regular rhythm.     Heart sounds: Normal heart sounds.  Pulmonary:     Effort: Pulmonary effort is  normal. No respiratory distress.     Breath sounds: Normal breath sounds. No wheezing.  Abdominal:     General: There is no distension.     Palpations: Abdomen is soft.     Tenderness: There is no abdominal tenderness.  Musculoskeletal:        General: No tenderness. Normal range of motion.     Cervical back: Normal range of motion and neck supple.  Lymphadenopathy:     Cervical: No cervical adenopathy.  Skin:    General: Skin is warm and dry.  Neurological:     Mental Status: He is alert and oriented to person, place, and time.     Deep Tendon Reflexes: Reflexes are normal and symmetric.  Psychiatric:        Behavior: Behavior normal.        Assessment & Plan:  Josias Tomerlin is a 43 y.o. male . Annual physical exam - Plan: CBC with Differential/Platelet, Comprehensive metabolic panel, Lipid panel, TSH, Hemoglobin A1c  - -anticipatory guidance as below in AVS, screening labs above. Health maintenance items as above in HPI discussed/recommended as applicable.   Essential hypertension - Plan: CBC with Differential/Platelet, amLODipine  (NORVASC ) 5 MG tablet, hydrochlorothiazide  (HYDRODIURIL ) 25 MG tablet, losartan  (COZAAR ) 100 MG tablet  -Tolerating current regimen, check labs, adjust plan accordingly.  No changes for now.  Hyperlipidemia, unspecified hyperlipidemia type - Plan: Comprehensive metabolic panel, Lipid panel  -Low ASCVD risk score previously.  Hyperglycemia - Plan: Comprehensive metabolic panel, Hemoglobin A1c  -Check A1c, adjust plan accordingly.  Screening for thyroid  disorder - Plan: TSH  Need for tetanus booster - Plan: Tdap vaccine greater than or equal to  7yo IM  Vasectomy evaluation - Plan: Ambulatory referral to Urology  Performance anxiety - Plan: propranolol  (INDERAL ) 10 MG tablet  -Low-dose propranolol  initially 10 to 20 mg 30 to 60 minutes prior to need.  Recommended trying medication at home first to make sure he tolerates.  Option of higher dose if  needed with potential side effects discussed, RTC precautions.  Meds ordered this encounter  Medications   amLODipine  (NORVASC ) 5 MG tablet    Sig: Take 1 tablet (5 mg total) by mouth daily.    Dispense:  90 tablet    Refill:  0   hydrochlorothiazide  (HYDRODIURIL ) 25 MG tablet    Sig: Take 1 tablet (25 mg total) by mouth daily.    Dispense:  90 tablet    Refill:  0   losartan  (COZAAR ) 100 MG tablet    Sig: Take 1 tablet (100 mg total) by mouth daily.    Dispense:  90 tablet    Refill:  0   propranolol  (INDERAL ) 10 MG tablet    Sig: Take 1-2 tablets (10-20 mg total) by mouth daily as needed.    Dispense:  30 tablet    Refill:  3   Patient Instructions  I will refer you to urology for discussion of vasectomy.  No medication changes for now.  I will check labs and let you know if there are concerns.  If liver tests are still elevated I think we should move forward with an ultrasound and possible follow-up with liver specialist.  I will let you know.    For performance anxiety, lets try propranolol  initially 10 mg 30 to 60 minutes prior to the anxiety inducing situation.  If that is ineffective, can increase that to 20 mg or 2 pills at a time.  Watch for lightheadedness, dizziness. You may want to try that medication prior to need for work to see how you tolerate it.  If no relief at 20 mg, let me know and we can talk about higher doses.  Thanks for coming in today and take care.  Preventive Care 2-82 Years Old, Male Preventive care refers to lifestyle choices and visits with your health care provider that can promote health and wellness. Preventive care visits are also called wellness exams. What can I expect for my preventive care visit? Counseling During your preventive care visit, your health care provider may ask about your: Medical history, including: Past medical problems. Family medical history. Current health, including: Emotional well-being. Home life and relationship  well-being. Sexual activity. Lifestyle, including: Alcohol, nicotine or tobacco, and drug use. Access to firearms. Diet, exercise, and sleep habits. Safety issues such as seatbelt and bike helmet use. Sunscreen use. Work and work astronomer. Physical exam Your health care provider will check your: Height and weight. These may be used to calculate your BMI (body mass index). BMI is a measurement that tells if you are at a healthy weight. Waist circumference. This measures the distance around your waistline. This measurement also tells if you are at a healthy weight and may help predict your risk of certain diseases, such as type 2 diabetes and high blood pressure. Heart rate and blood pressure. Body temperature. Skin for abnormal spots. What immunizations do I need?  Vaccines are usually given at various ages, according to a schedule. Your health care provider will recommend vaccines for you based on your age, medical history, and lifestyle or other factors, such as travel or where you work. What tests do I need? Screening  Your health care provider may recommend screening tests for certain conditions. This may include: Lipid and cholesterol levels. Diabetes screening. This is done by checking your blood sugar (glucose) after you have not eaten for a while (fasting). Hepatitis B test. Hepatitis C test. HIV (human immunodeficiency virus) test. STI (sexually transmitted infection) testing, if you are at risk. Lung cancer screening. Prostate cancer screening. Colorectal cancer screening. Talk with your health care provider about your test results, treatment options, and if necessary, the need for more tests. Follow these instructions at home: Eating and drinking  Eat a diet that includes fresh fruits and vegetables, whole grains, lean protein, and low-fat dairy products. Take vitamin and mineral supplements as recommended by your health care provider. Do not drink alcohol if your  health care provider tells you not to drink. If you drink alcohol: Limit how much you have to 0-2 drinks a day. Know how much alcohol is in your drink. In the [place] one drink equals one 12 oz bottle of beer (355 mL), one 5 oz glass of wine (148 mL), or one 1 oz glass of hard liquor (44 mL). Lifestyle Brush your teeth every morning and night with fluoride toothpaste. Floss one time each day. Exercise for at least 30 minutes 5 or more days each week. Do not use any products that contain nicotine or tobacco. These products include cigarettes, chewing tobacco, and vaping devices, such as e-cigarettes. If you need help quitting, ask your health care provider. Do not use drugs. If you are sexually active, practice safe sex. Use a condom or other form of protection to prevent STIs. Take aspirin only as told by your health care provider. Make sure that you understand how much to take and what form to take. Work with your health care provider to find out whether it is safe and beneficial for you to take aspirin daily. Find healthy ways to manage stress, such as: Meditation, yoga, or listening to music. Journaling. Talking to a trusted person. Spending time with friends and family. Minimize exposure to UV radiation to reduce your risk of skin cancer. Safety Always wear your seat belt while driving or riding in a vehicle. Do not drive: If you have been drinking alcohol. Do not ride with someone who has been drinking. When you are tired or distracted. While texting. If you have been using any mind-altering substances or drugs. Wear a helmet and other protective equipment during sports activities. If you have firearms in your house, make sure you follow all gun safety procedures. What's next? Go to your health care provider once a year for an annual wellness visit. Ask your health care provider how often you should have your eyes and teeth checked. Stay up to date on all vaccines. This information  is not intended to replace advice given to you by your health care provider. Make sure you discuss any questions you have with your health care provider. Document Revised: 02/04/2021 Document Reviewed: 02/04/2021 Elsevier Patient Education  2024 Elsevier Inc.     Signed,   Reyes Pines, MD [facility], [facility] 09/02/23 10:00 AM

## 2023-09-02 NOTE — Patient Instructions (Signed)
 I will refer you to urology for discussion of vasectomy.  No medication changes for now.  I will check labs and let you know if there are concerns.  If liver tests are still elevated I think we should move forward with an ultrasound and possible follow-up with liver specialist.  I will let you know.    For performance anxiety, lets try propranolol  initially 10 mg 30 to 60 minutes prior to the anxiety inducing situation.  If that is ineffective, can increase that to 20 mg or 2 pills at a time.  Watch for lightheadedness, dizziness. You may want to try that medication prior to need for work to see how you tolerate it.  If no relief at 20 mg, let me know and we can talk about higher doses.  Thanks for coming in today and take care.  Preventive Care 67-97 Years Old, Male Preventive care refers to lifestyle choices and visits with your health care provider that can promote health and wellness. Preventive care visits are also called wellness exams. What can I expect for my preventive care visit? Counseling During your preventive care visit, your health care provider may ask about your: Medical history, including: Past medical problems. Family medical history. Current health, including: Emotional well-being. Home life and relationship well-being. Sexual activity. Lifestyle, including: Alcohol, nicotine or tobacco, and drug use. Access to firearms. Diet, exercise, and sleep habits. Safety issues such as seatbelt and bike helmet use. Sunscreen use. Work and work astronomer. Physical exam Your health care provider will check your: Height and weight. These may be used to calculate your BMI (body mass index). BMI is a measurement that tells if you are at a healthy weight. Waist circumference. This measures the distance around your waistline. This measurement also tells if you are at a healthy weight and may help predict your risk of certain diseases, such as type 2 diabetes and high blood  pressure. Heart rate and blood pressure. Body temperature. Skin for abnormal spots. What immunizations do I need?  Vaccines are usually given at various ages, according to a schedule. Your health care provider will recommend vaccines for you based on your age, medical history, and lifestyle or other factors, such as travel or where you work. What tests do I need? Screening Your health care provider may recommend screening tests for certain conditions. This may include: Lipid and cholesterol levels. Diabetes screening. This is done by checking your blood sugar (glucose) after you have not eaten for a while (fasting). Hepatitis B test. Hepatitis C test. HIV (human immunodeficiency virus) test. STI (sexually transmitted infection) testing, if you are at risk. Lung cancer screening. Prostate cancer screening. Colorectal cancer screening. Talk with your health care provider about your test results, treatment options, and if necessary, the need for more tests. Follow these instructions at home: Eating and drinking  Eat a diet that includes fresh fruits and vegetables, whole grains, lean protein, and low-fat dairy products. Take vitamin and mineral supplements as recommended by your health care provider. Do not drink alcohol if your health care provider tells you not to drink. If you drink alcohol: Limit how much you have to 0-2 drinks a day. Know how much alcohol is in your drink. In the [place] one drink equals one 12 oz bottle of beer (355 mL), one 5 oz glass of wine (148 mL), or one 1 oz glass of hard liquor (44 mL). Lifestyle Brush your teeth every morning and night with fluoride toothpaste. Floss one time each  day. Exercise for at least 30 minutes 5 or more days each week. Do not use any products that contain nicotine or tobacco. These products include cigarettes, chewing tobacco, and vaping devices, such as e-cigarettes. If you need help quitting, ask your health care provider. Do not  use drugs. If you are sexually active, practice safe sex. Use a condom or other form of protection to prevent STIs. Take aspirin only as told by your health care provider. Make sure that you understand how much to take and what form to take. Work with your health care provider to find out whether it is safe and beneficial for you to take aspirin daily. Find healthy ways to manage stress, such as: Meditation, yoga, or listening to music. Journaling. Talking to a trusted person. Spending time with friends and family. Minimize exposure to UV radiation to reduce your risk of skin cancer. Safety Always wear your seat belt while driving or riding in a vehicle. Do not drive: If you have been drinking alcohol. Do not ride with someone who has been drinking. When you are tired or distracted. While texting. If you have been using any mind-altering substances or drugs. Wear a helmet and other protective equipment during sports activities. If you have firearms in your house, make sure you follow all gun safety procedures. What's next? Go to your health care provider once a year for an annual wellness visit. Ask your health care provider how often you should have your eyes and teeth checked. Stay up to date on all vaccines. This information is not intended to replace advice given to you by your health care provider. Make sure you discuss any questions you have with your health care provider. Document Revised: 02/04/2021 Document Reviewed: 02/04/2021 Elsevier Patient Education  2024 [employer].

## 2023-09-03 ENCOUNTER — Other Ambulatory Visit: Payer: Self-pay | Admitting: Family Medicine

## 2023-09-03 ENCOUNTER — Encounter: Payer: Self-pay | Admitting: Family Medicine

## 2023-09-03 DIAGNOSIS — R7989 Other specified abnormal findings of blood chemistry: Secondary | ICD-10-CM

## 2023-09-03 NOTE — Progress Notes (Signed)
 See labs, hepatic ultrasound ordered for elevated LFTs.

## 2023-09-11 ENCOUNTER — Other Ambulatory Visit: Payer: Self-pay | Admitting: Family Medicine

## 2023-09-11 DIAGNOSIS — F418 Other specified anxiety disorders: Secondary | ICD-10-CM

## 2023-09-28 ENCOUNTER — Ambulatory Visit: Payer: BC Managed Care – PPO | Admitting: Urology

## 2023-09-28 ENCOUNTER — Encounter: Payer: Self-pay | Admitting: Urology

## 2023-09-28 VITALS — BP 159/89 | HR 78 | Ht 70.0 in | Wt 255.0 lb

## 2023-09-28 DIAGNOSIS — Z3009 Encounter for other general counseling and advice on contraception: Secondary | ICD-10-CM | POA: Diagnosis not present

## 2023-09-28 MED ORDER — ALPRAZOLAM 1 MG PO TABS: ORAL_TABLET | ORAL | 0 refills | Status: AC

## 2023-09-28 NOTE — Patient Instructions (Signed)
 Vasectomy is a safe, simple, and effective office based procedure that provides men with permanent sterility.  The no scalpel technique has been a refinement but often results in less swelling and pain than the traditional vasectomy method.  Prior to a vasectomy, it is important to make a decision that you are interested in permanent sterility and that you and your partner must be completely sure that you do not want children in the future.  To prepare for the procedure, stop taking any aspirin or blood thinners for 1 week prior to the procedure.  The day of your procedure take a shower and thoroughly clean your scrotum.  It is not necessary to shave prior to the procedure as any shaving that is needed will be performed in the office.  Bring a pair of tight underwear such as briefs, boxer briefs, or athletic shorts with you for the day of the procedure.  You may eat a light meal prior to the procedure.  During the procedure, the scrotal skin will be sterilized completely.  Anesthesia will be provided by injection of a local anesthetic into the scrotum which will provide anesthesia for 2-3 hours after the procedure.  Once the local anesthetic takes effect, a tiny puncture is made in the middle of the front of the scrotum through which both vasa deferens tubes can be partially removed and the ends of the tubes cauterized.  A small absorbable suture is placed in the skin incision.  Antibiotic ointment and sterile gauze dressings are applied and are held in place with the undergarment.  Prescriptions for an antibiotic and pain medication will be sent to your pharmacy.  The antibiotic should be taken to completion.  The pain medication can be taken as needed every 4-6 hours.  If a narcotic pain medication is too strong, over-the-counter analgesics such as Tylenol or ibuprofen may be taken instead.  After the procedure, it is important to take it easy for a couple of days and apply the ice pack to the scrotal area.   The ice pack goes on top of the undergarment and should be used for 10-15 minutes at a time while you are awake.  After the first 2 days, you can gradually increase your activity.  During the first week, it is important to avoid strenuous activity or activity that puts pressure in the scrotal area.  It is also advisable to restrain from heavy lifting or long distance running during this time period.  Often, supportive underwear is helpful to reduce pain during the first week.  You should also avoid sexual activity for 10 days.  You can resume normal activities after 1 week and sexual relations in 10 days.  One of the most important considerations after vasectomy is that you are still fertile after the procedure.  It generally takes at least 20 ejaculations and 3 months time before you are considered sterile.  Even 3 months after the procedure, some men will have a few persistent sperm present.  To be considered sterile, you will need to produce a semen sample that shows no sperm.  You will receive instructions to bring in your first semen specimen to the office 3 months after the procedure.  It is very important that you continue to use your current method of birth control during this time as it is possible to achieve a pregnancy until you become sterile.  Potential complications of vasectomy include bleeding (less than 1% risk of serious bleeding), infection (less than 1%), reconnection of  the vas deferens (08/998 chance), pregnancy (08/1998 chance), shrinkage of the testicle (08/4998 chance), and chronic pain (2-4%).  Other potential consequences of vasectomy include sperm granuloma (a small round area of scar tissue in the area of the procedure is performed), and congestion of the epididymis (a fullness of the tubes where sperm are stored).  Sperm will continue to be produced by the testicles, but the sperm will eventually die and be absorbed by the body.  The amount of semen that is produced will not change.   The main difference is that the semen will not contain sperm after a man has become sterile.  The procedure does not affect your urination, sex drive, or erections.  In summary, vasectomy provides permanent birth control for men.  It is an office-based procedure which is safe, effective, and economical.  After the procedure, it takes time to become sterile so proper precautions must be taken until sterility is achieved.    Taking care of yourself after a VASECTOMY                                              Patient Information Sheet        The following information will reinforce some of the instructions that your doctor has given you.  Day of Procedure: 1) Wear the scrotal supporter and gauze pad 2) Use an ice pack on the scrotum for 15 minutes every hour for 48 hours to help reduce discomfort, swelling and bruising (do NOT place ice directly on your skin, but place on top of the supporter) 3) Expect some clear to pinkish drainage at the surgical site for the first 24-48 hours 4) If needed, use pain medications provided or ibuprofen 800 mg every 8 hours for discomfort 5) Avoid strenuous activities like mowing, lifting, jogging and exercising for 1 week.  Take it easy! 6) If you develop a fever over 101 F or sudden onset of significant swelling within the first 12 hours, please call to report this to your doctor as soon as possible.     Day Two and Three: 1) You may take a shower, but avoid tubs, pools or hot tubs. 2) Continue to wear the scrotal supporter as needed for comfort and change or remove the gauze pad if desired 3) Keep taking it easy!  Avoid strenuous activities like mowing, lifting, jogging and exercising.   4) Continue to watch for signs or symptoms of fever or significant swelling 5) Apply a small amount of antibiotic ointment to incision 1-2 times/day  The rest of the week: 1) Gradually return to normal physical activities after one week.  A return of soreness might  mean you are        "doing too much too soon". 2) Avoid sexual activity for 10 days after the procedure 3) Continue to take a shower, but avoid tubs, pools or hot tubs 4) Wearing the scrotal supporter is optional based on your comfort.     Remember to use an alternate form of contraception for 3 months until you have been checked and CLEARED by your urologist!  62-Month lab appointment:  1) The lab technician will need to look at a semen sample under a microscope  2) Use the specimen cup provided to collect the sample AT HOME 1 hour before the appointment  3) DO NOT refrigerate the specimen, but keep  at room or body temperature  4) Avoid ejaculation for 2-5 days before collecting the specimen  5) Collect the entire specimen by masturbation using NO lubricant  6) Make sure your name, MR number, date and time of collection are on the cup

## 2023-09-28 NOTE — Progress Notes (Signed)
 Assessment: 1. Encounter for vasectomy assessment     Plan: Schedule for vasectomy per patient request Rx for alprazolam  1 mg pre-procedure provided.   Chief Complaint:  Chief Complaint  Patient presents with   VAS Consult    History of Present Illness:  Cameron Norris is a 43 y.o. male who is seen for vasectomy evaluation. He is married with 2 children.  No history of scrotal trauma or infection.  Past Medical History:  Past Medical History:  Diagnosis Date   COVID-19    Hypertension     Past Surgical History:  Past Surgical History:  Procedure Laterality Date   WISDOM TOOTH EXTRACTION      Allergies:  Allergies  Allergen Reactions   Ace Inhibitors Cough    Family History:  Family History  Problem Relation Age of Onset   Colon cancer Maternal Grandmother     Social History:  Social History   Tobacco Use   Smoking status: Never   Smokeless tobacco: Never  Vaping Use   Vaping status: Never Used  Substance Use Topics   Alcohol use: Yes    Comment: occ   Drug use: No    Review of symptoms:  Constitutional:  Negative for unexplained weight loss, night sweats, fever, chills ENT:  Negative for nose bleeds, sinus pain, painful swallowing CV:  Negative for chest pain, shortness of breath, exercise intolerance, palpitations, loss of consciousness Resp:  Negative for cough, wheezing, shortness of breath GI:  Negative for nausea, vomiting, diarrhea, bloody stools GU:  Positives noted in HPI; otherwise negative for gross hematuria, dysuria, urinary incontinence Neuro:  Negative for seizures, poor balance, limb weakness, slurred speech Psych:  Negative for lack of energy, depression, anxiety Endocrine:  Negative for polydipsia, polyuria, symptoms of hypoglycemia (dizziness, hunger, sweating) Hematologic:  Negative for anemia, purpura, petechia, prolonged or excessive bleeding, use of anticoagulants  Allergic:  Negative for difficulty breathing or choking as  a result of exposure to anything; no shellfish allergy; no allergic response (rash/itch) to materials, foods  Physical exam: BP (!) 159/89   Pulse 78   Ht 5' 10 (1.778 m)   Wt 255 lb (115.7 kg)   BMI 36.59 kg/m  GENERAL APPEARANCE:  Well appearing, well developed, well nourished, NAD HEENT:  Atraumatic, normocephalic, oropharynx clear NECK:  Supple without lymphadenopathy or thyromegaly ABDOMEN:  Soft, non-tender, no masses EXTREMITIES:  Moves all extremities well, without clubbing, cyanosis, or edema NEUROLOGIC:  Alert and oriented x 3, normal gait, CN II-XII grossly intact MENTAL STATUS:  appropriate BACK:  Non-tender to palpation, No CVAT SKIN:  Warm, dry, and intact GU: Penis:  circumcised Meatus: Normal Scrotum: Thick cords bilaterally; vas palpated bilaterally Testis: normal without masses bilateral  Results: None  VASECTOMY CONSULTATION  Cameron Norris presents for vasectomy consultation today.  He is a 43 y.o. male, Married with 2 children.  He and his wife have discussed the issues regarding long-term fertility and are comfortable with this decision.  He presents for consideration for vasectomy.  I discussed the issues in detail with him today and he expressed no reservations.  As to the procedure, no scalpel technique vasectomy is explained and reviewed in detail.  Generalized risks including but not limited to bleeding, infection, orchalgia, testicular atrophy, epididymitis, scrotal hematoma, and chronic pain are discussed.   Additionally, he understands that the possibility of vas recanalization following vasectomy is possible although rare.  Most importantly, the patient understands that he is not sterile initially and will need a  semen analysis check to confirm sterility such that no sperm are seen.  He is advised to avoid ejaculation for 10 days following the procedure.  The initial semen analysis will be checked in approximately 12 weeks and in some patients, several  months may be required for clearance of all sperm.  He reports a clear understanding of the need for continued birth control until sterility is confirmed.  Otherwise, general issues regarding local anesthesia, prep, alprazolam  are discussed and he reports a clear understanding.

## 2023-11-14 ENCOUNTER — Ambulatory Visit: Payer: BC Managed Care – PPO | Admitting: Urology

## 2023-11-14 ENCOUNTER — Encounter: Payer: Self-pay | Admitting: Urology

## 2023-11-14 VITALS — BP 137/84 | HR 96 | Ht 70.0 in | Wt 250.0 lb

## 2023-11-14 DIAGNOSIS — Z302 Encounter for sterilization: Secondary | ICD-10-CM

## 2023-11-14 MED ORDER — HYDROCODONE-ACETAMINOPHEN 5-325 MG PO TABS: 1.0000 | ORAL_TABLET | Freq: Four times a day (QID) | ORAL | 0 refills | Status: AC | PRN

## 2023-11-14 MED ORDER — CEPHALEXIN 500 MG PO CAPS: 500.0000 mg | ORAL_CAPSULE | Freq: Three times a day (TID) | ORAL | 0 refills | Status: AC

## 2023-11-14 NOTE — Patient Instructions (Signed)

## 2023-11-14 NOTE — Progress Notes (Signed)
   Assessment: 1. Encounter for vasectomy     Plan: Post vasectomy instructions given Rx sent. Post vasectomy semen analysis in 12 weeks.  Chief Complaint:  Chief Complaint  Patient presents with   VAS    History of Present Illness:  Cameron Norris is a 43 y.o. male who is seen for vasectomy. He is married with 2 children.  No history of scrotal trauma or infection.  Past Medical History:  Past Medical History:  Diagnosis Date   COVID-19    Hypertension     Past Surgical History:  Past Surgical History:  Procedure Laterality Date   WISDOM TOOTH EXTRACTION      Allergies:  Allergies  Allergen Reactions   Ace Inhibitors Cough    Family History:  Family History  Problem Relation Age of Onset   Colon cancer Maternal Grandmother     Social History:  Social History   Tobacco Use   Smoking status: Never   Smokeless tobacco: Never  Vaping Use   Vaping status: Never Used  Substance Use Topics   Alcohol use: Yes    Comment: occ   Drug use: No    ROS: Constitutional:  Negative for fever, chills, weight loss CV: Negative for chest pain, previous MI, hypertension Respiratory:  Negative for shortness of breath, wheezing, sleep apnea, frequent cough GI:  Negative for nausea, vomiting, bloody stool, GERD  Physical exam: BP 137/84   Pulse 96   Ht 5\' 10"  (1.778 m)   Wt 250 lb (113.4 kg)   BMI 35.87 kg/m  GENERAL APPEARANCE:  Well appearing, well developed, well nourished, NAD HEENT:  Atraumatic, normocephalic, oropharynx clear NECK:  Supple without lymphadenopathy or thyromegaly ABDOMEN:  Soft, non-tender, no masses EXTREMITIES:  Moves all extremities well, without clubbing, cyanosis, or edema NEUROLOGIC:  Alert and oriented x 3, normal gait, CN II-XII grossly intact MENTAL STATUS:  appropriate BACK:  Non-tender to palpation, No CVAT SKIN:  Warm, dry, and intact  Results: None  VASECTOMY PROCEDURE:  Cameron Norris presents for vasectomy following  previous vasectomy consultation and permit is signed.  The patient's anterior scrotal wall is shaved and prepped with Betadine in standard sterile fashion.  1% lidocaine is used as local anesthetic in the scrotal and peri vasal tissue.  A standard median raphe punch incision is made and a no scalpel technique vasectomy is performed.  Bilateral vas are isolated from the peri vasal tissue and an approximately 1 cm segment of vas is excised.  Proximal and distal segments are internally cauterized with electric heat cautery. Interposition of perivasal tissue was performed.  Bilateral palpation confirms bilateral vasectomy defect and no significant bleeding or hematoma is identified.  Neosporin gauze dressing and a scrotal support are applied.    Disposition: Patient is discharged home with Rx for pain medication and antibiotics.  Patient is given routine vasectomy instructions.   Most importantly, he is instructed and cautioned again regarding the need for protected intercourse until such time that a single  negative semen analysis has been obtained.  The initial semen analysis will be checked in approximately 12  weeks.  The patient reports a clear understanding.  He will call with any interval questions or concerns.

## 2023-12-29 ENCOUNTER — Other Ambulatory Visit: Payer: Self-pay | Admitting: Family Medicine

## 2023-12-29 DIAGNOSIS — I10 Essential (primary) hypertension: Secondary | ICD-10-CM

## 2024-02-13 ENCOUNTER — Encounter: Payer: Self-pay | Admitting: Urology

## 2024-02-14 ENCOUNTER — Other Ambulatory Visit

## 2024-02-14 DIAGNOSIS — Z302 Encounter for sterilization: Secondary | ICD-10-CM | POA: Diagnosis not present

## 2024-02-15 LAB — POST-VAS SPERM EVALUATION,QUAL: Volume: 1.8 mL

## 2024-02-16 ENCOUNTER — Ambulatory Visit: Payer: Self-pay | Admitting: Urology

## 2024-02-26 ENCOUNTER — Other Ambulatory Visit: Payer: Self-pay | Admitting: Family Medicine

## 2024-02-26 DIAGNOSIS — F418 Other specified anxiety disorders: Secondary | ICD-10-CM

## 2024-03-28 ENCOUNTER — Other Ambulatory Visit: Payer: Self-pay | Admitting: Family Medicine

## 2024-03-28 DIAGNOSIS — I10 Essential (primary) hypertension: Secondary | ICD-10-CM

## 2024-07-01 ENCOUNTER — Other Ambulatory Visit: Payer: Self-pay | Admitting: Family Medicine

## 2024-07-01 DIAGNOSIS — I10 Essential (primary) hypertension: Secondary | ICD-10-CM

## 2024-09-07 ENCOUNTER — Encounter: Payer: BC Managed Care – PPO | Admitting: Family Medicine

## 2024-11-14 ENCOUNTER — Encounter: Admitting: Family Medicine
